# Patient Record
Sex: Male | Born: 1975 | Race: White | Hispanic: No | Marital: Married | Smoking: Current every day smoker
Health system: Southern US, Community
[De-identification: ages and names within clinical notes are randomized; demographics above are authoritative.]

## PROBLEM LIST (undated history)

## (undated) DIAGNOSIS — I10 Essential (primary) hypertension: Secondary | ICD-10-CM

## (undated) DIAGNOSIS — R7303 Prediabetes: Secondary | ICD-10-CM

## (undated) DIAGNOSIS — J189 Pneumonia, unspecified organism: Secondary | ICD-10-CM

## (undated) DIAGNOSIS — J449 Chronic obstructive pulmonary disease, unspecified: Secondary | ICD-10-CM

## (undated) DIAGNOSIS — E785 Hyperlipidemia, unspecified: Secondary | ICD-10-CM

## (undated) DIAGNOSIS — L0591 Pilonidal cyst without abscess: Secondary | ICD-10-CM

## (undated) DIAGNOSIS — L732 Hidradenitis suppurativa: Secondary | ICD-10-CM

## (undated) HISTORY — DX: Chronic obstructive pulmonary disease, unspecified: J44.9

---

## 2012-05-14 ENCOUNTER — Ambulatory Visit: Payer: Self-pay | Admitting: Surgery

## 2012-05-14 LAB — CBC
HGB: 15.8 g/dL (ref 13.0–18.0)
MCH: 32.7 pg (ref 26.0–34.0)
MCHC: 35.2 g/dL (ref 32.0–36.0)
MCV: 93 fL (ref 80–100)
Platelet: 141 10*3/uL — ABNORMAL LOW (ref 150–440)
RBC: 4.83 10*6/uL (ref 4.40–5.90)

## 2012-05-16 ENCOUNTER — Ambulatory Visit: Payer: Self-pay | Admitting: Surgery

## 2012-05-17 LAB — PATHOLOGY REPORT

## 2012-08-15 HISTORY — PX: PILONIDAL CYST EXCISION: SHX744

## 2013-08-15 DIAGNOSIS — J189 Pneumonia, unspecified organism: Secondary | ICD-10-CM

## 2013-08-15 HISTORY — DX: Pneumonia, unspecified organism: J18.9

## 2014-12-02 NOTE — Op Note (Signed)
PATIENT NAME:  Manuel SearlesDEAN, Capri W MR#:  [id number] DATE OF BIRTH:  August 03, 1976  DATE OF PROCEDURE:  05/16/2012  PREOPERATIVE DIAGNOSIS: Pilonidal cyst.   POSTOPERATIVE DIAGNOSIS: Pilonidal cyst.   PROCEDURE: Excision pilonidal cyst.   INDICATION: This 39 year old male had recent pain and fever, chills, and night sweats, with drainage of a large amount of foul-smelling pus from the sacrococcygeal area. He was later examined after his infection resolved and found three pores in the skin indicative of a pilonidal cyst and surgery was recommended for definitive treatment.   DESCRIPTION OF PROCEDURE: The patient was placed on the stretcher in the supine position under general endotracheal anesthesia. He was then rolled over onto the operating table into the prone position, well padded and arms extended on an arm rest. The buttock on the left side was retracted with 3-inch cloth tape exposing the intergluteal cleft. There were three pores identified in the skin. The site was clipped and prepared with Betadine and draped in a sterile manner.   A probe was inserted into the largest pore and extended approximately 2 cm in a cephalad direction and extended down approximately a centimeter deep. Next, the excision was carried out with the probe in place. The skin ellipse was approximately 3 cm in length and dissection was carried down through subcutaneous tissues. The skin ellipse was approximately 6 mm in width and this dissection was carried down beyond the probe down adjacent to the presacral fascia and the lesion was completely excised and submitted in formalin for routine pathology. The wound was inspected. Several small bleeding points were cauterized. The skin and subcutaneous tissues were infiltrated with 0.5% Sensorcaine with epinephrine. The presacral connective tissues were approximated with 4-0 Monocryl. The skin was closed with a running 4-0 Monocryl subcuticular suture and then the skin closure was  reinforced with simple 3-0 nylon sutures. Dressings were applied with benzoin and paper tape.       The patient tolerated the procedure satisfactorily and was then prepared for transfer to the recovery room.   ____________________________ Shela CommonsJ. Renda RollsWilton Smith, MD jws:bjt D: 05/16/2012 11:26:47 ET T: 05/16/2012 11:48:34 ET JOB#: [id number]  cc: Adella HareJ. Wilton Smith, MD, <Dictator> Adella HareWILTON J SMITH MD ELECTRONICALLY SIGNED 05/20/2012 19:29

## 2015-01-30 ENCOUNTER — Emergency Department
Admission: EM | Admit: 2015-01-30 | Discharge: 2015-01-30 | Disposition: A | Discharge status: Home / Self Care | Payer: BLUE CROSS/BLUE SHIELD | Attending: Emergency Medicine | Admitting: Emergency Medicine

## 2015-01-30 DIAGNOSIS — Z880 Allergy status to penicillin: Secondary | ICD-10-CM | POA: Insufficient documentation

## 2015-01-30 DIAGNOSIS — K611 Rectal abscess: Secondary | ICD-10-CM | POA: Insufficient documentation

## 2015-01-30 DIAGNOSIS — Z4801 Encounter for change or removal of surgical wound dressing: Secondary | ICD-10-CM | POA: Insufficient documentation

## 2015-01-30 MED ORDER — OXYCODONE-ACETAMINOPHEN 7.5-325 MG PO TABS: 1.0000 | ORAL_TABLET | ORAL | Status: DC | PRN

## 2015-01-30 MED ORDER — OXYCODONE-ACETAMINOPHEN 5-325 MG PO TABS
1.0000 | ORAL_TABLET | Freq: Once | ORAL | Status: AC
  Administered 2015-01-30: 1 via ORAL

## 2015-01-30 MED ORDER — LIDOCAINE HCL 2 % EX GEL
CUTANEOUS | Status: AC
  Filled 2015-01-30: qty 10

## 2015-01-30 MED ORDER — OXYCODONE-ACETAMINOPHEN 5-325 MG PO TABS
ORAL_TABLET | ORAL | Status: AC
  Filled 2015-01-30: qty 1

## 2015-01-30 NOTE — ED Notes (Signed)
Sent in from [facility] for perirectal abscess. Was seen on Monday and given anusol  Went back on weds and placed on septra  Worse today

## 2015-01-30 NOTE — ED Provider Notes (Signed)
[facility] Emergency Department Provider Note  ____________________________________________  Time seen:    I have reviewed the triage vital signs and the nursing notes.   HISTORY  Chief Complaint Abscess     HPI Manuel Dixon is a 39 y.o. male Noted some swelling and discomfort around his anus approximately a week ago. This past Monday he went to an urgent care center due to his concerns for hemorrhoids. They checked the rectal area and noticed a bulge next to the anus and began treatment for a hemorrhoid. He returned on Wednesday and was seen by a different physician at the same urgent care. He felt that the bulge, which had grown further and was tracking on his perennial area, was an abscess and performed an incision and drainage. The patient was prescribed Bactrim and Flagyl for the suspected infection. He went back to urgent care today and due to worsening pain he was sent to the emergency department.  The patient continues to complain of her brother severe pain in the perianal area. He is humerus and can laugh about aspects of this. But he reports that in the exam is very tender. He has been using hydrocortisone and lidocaine suppositories. He reports that the pain is quite severe when he places the suppository in.   No past medical history on file.  Patient denies health issues other then the acute issue today.   There are no active problems to display for this patient.   No past surgical history on file.  Current Outpatient Rx  Name  Route  Sig  Dispense  Refill  . sulfamethoxazole-trimethoprim (BACTRIM,SEPTRA) 400-80 MG per tablet   Oral   Take 1 tablet by mouth 2 (two) times daily.         Marland Kitchen oxyCODONE-acetaminophen (PERCOCET) 7.5-325 MG per tablet   Oral   Take 1 tablet by mouth every 4 (four) hours as needed for severe pain.   20 tablet   0     Allergies Amoxil  No family history on file.  Social History History  Substance Use  Topics  . Smoking status: Not on file  . Smokeless tobacco: Not on file  . Alcohol Use: Not on file    Review of Systems  Constitutional: Negative for fever. ENT: Negative for sore throat. Cardiovascular: Negative for chest pain. Respiratory: Negative for shortness of breath. Gastrointestinal: Negative for abdominal pain, vomiting and diarrhea. Genitourinary: Negative for dysuria. Musculoskeletal: Negative for back pain. Skin: Negative for rash. Neurological: Negative for headaches Rectal: See history of present illness  10-point ROS otherwise negative.  ____________________________________________   PHYSICAL EXAM:  VITAL SIGNS: ED Triage Vitals  Enc Vitals Group     BP 01/30/15 1749 144/70 mmHg     Pulse Rate 01/30/15 1749 100     Resp 01/30/15 1749 20     Temp 01/30/15 1749 97 F (36.1 C)     Temp Source 01/30/15 1749 Oral     SpO2 01/30/15 1749 100 %     Weight 01/30/15 1749 235 lb (106.595 kg)     Height 01/30/15 1749 5\' 10"  (1.778 m)     Head Cir --      Peak Flow --      Pain Score 01/30/15 1750 9     Pain Loc --      Pain Edu? --      Excl. in GC? --     Constitutional: Alert and oriented. Well appearing. Patient is initially in the  prone position and in no acute distress. Cardiovascular: Normal rate, regular rhythm. Respiratory: Normal respiratory effort without tachypnea. Breath sounds are clear and equal bilaterally. No wheezes/rales/rhonchi. Gastrointestinal: Soft and nontender. No distention.  Rectal: There is a bulging pink strip of tissue that extends from the anterior aspect of the anus and moves forward over the MP there is an incision in this inflamed tissue that was likely majoring the incision and drainage 2 days ago. The area is notably tender. There is minimal erythema around this area. There is no larger nodule or a perirectal abscess noted. Back: No muscle spasm, no tenderness, no CVA tenderness. Musculoskeletal: Nontender with normal range  of motion in all extremities.  No noted edema. Neurologic:  Normal speech and language. No gross focal neurologic deficits are appreciated.  Skin:  Skin is warm, dry. No rash noted. Swelling and tenderness to the perirectal area as noted above. Psychiatric: Mood and affect are normal. Speech and behavior are normal.  ____________________________________________ ____________________________________________   INITIAL IMPRESSION / ASSESSMENT AND PLAN / ED COURSE  The patient has an inflamed area and adjacent to his anus. I think this is a simple perirectal abscess. It does not appear to be a very large abscess but rather more superficial. It is notably tender. During a digital exam I was unable to place my finger into his rectum. I did apply viscous lidocaine over the area to help him with some pain control. I think he appropriate course of action is to continue with the antibiotics and to continue with sitz bath. We have the patient follow-up with general surgery.  ----------------------------------------- 7:35 PM on 01/30/2015 -----------------------------------------  I discussed case with Dr. Lane Hacker. He reports a be glad to see the patient in his office. He agrees with my current treatment plan and agrees that if the patient does not look septic the chance that a CT scan would reveal any deeper pathology is very low. ____________________________________________    FINAL CLINICAL IMPRESSION(S) / ED DIAGNOSES  Final diagnoses:  Perirectal abscess      Darien Ramus, MD 01/30/15 [phone]

## 2015-01-30 NOTE — Discharge Instructions (Signed)
Expect call from Dr. Rutherford Nail office on Monday morning. They will arrange an appointment for you for your follow-up. If you have increasing distress or other problems this weekend, return to the emergency department. Continue your current antibiotics. Take Percocet if needed for pain control. Continue the sitz baths.  Peri-Rectal Abscess Your caregiver has diagnosed you as having a peri-rectal abscess. This is an infected area near the rectum that is filled with pus. If the abscess is near the surface of the skin, your caregiver may open (incise) the area and drain the pus. HOME CARE INSTRUCTIONS   If your abscess was opened up and drained. A small piece of gauze may be placed in the opening so that it can drain. Do not remove the gauze unless directed by your caregiver.  A loose dressing may be placed over the abscess site. Change the dressing as often as necessary to keep it clean and dry.  After the drain is removed, the area may be washed with a gentle antiseptic (soap) four times per day.  A warm sitz bath, warm packs or heating pad may be used for pain relief, taking care not to burn yourself.  Return for a wound check in 1 day or as directed.  An "inflatable doughnut" may be used for sitting with added comfort. These can be purchased at a drugstore or medical supply house.  To reduce pain and straining with bowel movements, eat a high fiber diet with plenty of fruits and vegetables. Use stool softeners as recommended by your caregiver. This is especially important if narcotic type pain medications were prescribed as these may cause marked constipation.  Only take over-the-counter or prescription medicines for pain, discomfort, or fever as directed by your caregiver. SEEK IMMEDIATE MEDICAL CARE IF:   You have increasing pain that is not controlled by medication.  There is increased inflammation (redness), swelling, bleeding, or drainage from the area.  An oral temperature above  102 F (38.9 C) develops.  You develop chills or generalized malaise (feel lethargic or feel "washed out").  You develop any new symptoms (problems) you feel may be related to your present problem. Document Released: 07/29/2000 Document Revised: 10/24/2011 Document Reviewed: 07/29/2008 [facility] Patient Information 2015 [place], [place]. This information is not intended to replace advice given to you by your health care provider. Make sure you discuss any questions you have with your health care provider.

## 2015-01-30 NOTE — ED Notes (Signed)
See previous note by RN.

## 2015-02-02 ENCOUNTER — Ambulatory Visit: Payer: Self-pay | Admitting: Urgent Care

## 2015-02-04 ENCOUNTER — Ambulatory Visit (INDEPENDENT_AMBULATORY_CARE_PROVIDER_SITE_OTHER): Payer: BLUE CROSS/BLUE SHIELD | Admitting: General Surgery

## 2015-02-04 ENCOUNTER — Encounter: Payer: Self-pay | Admitting: General Surgery

## 2015-02-04 VITALS — BP 110/78 | HR 82 | Resp 14 | Ht 70.0 in | Wt 235.0 lb

## 2015-02-04 DIAGNOSIS — K611 Rectal abscess: Secondary | ICD-10-CM | POA: Diagnosis not present

## 2015-02-04 NOTE — Patient Instructions (Signed)
Patient to call on Monday, 02-09-15 with a progress report.

## 2015-02-04 NOTE — Progress Notes (Signed)
Patient ID: Manuel Dixon, male   DOB: Aug 13, 1976, 39 y.o.   MRN: [id number]  Chief Complaint  Patient presents with  . Rectal Problems    perianal abscess    HPI [name] is a 39 y.o. male.  Here today for evaluation of a perianal abscess. He was seen at Urgent care on 01/26/15 and returned on 01/28/15 and had the area lanced and was placed on Flagyl. He was then seen in the ER on 01/30/15 and was placed on pain medications and Bactrim. He reports brown drainage from area with a bad odor. He denies any fever or chills. He reports he is still in pain but it has improved since his ER visit. Pain is worse with standing up or sitting down.  The pain prior to incision and drainage was rated 10 out of 10. Since being on exam biotics for the past 5 days based bar he reports it is down to 4/10.   HPI  No past medical history on file.  Past Surgical History  Procedure Laterality Date  . Pilonidal cyst excision  2014    Dr Katrinka Blazing    No family history on file.  Social History History  Substance Use Topics  . Smoking status: Current Every Day Smoker -- 1.00 packs/day for 20 years    Types: Cigarettes  . Smokeless tobacco: Never Used  . Alcohol Use: 3.6 - 7.2 oz/week    6-12 Standard drinks or equivalent per week    Allergies  Allergen Reactions  . Amoxil [Amoxicillin]   . Codeine Nausea Only    Current Outpatient Prescriptions  Medication Sig Dispense Refill  . metroNIDAZOLE (FLAGYL) 250 MG tablet TK 1 T PO TID FOR 10 DAYS  0  . oxyCODONE-acetaminophen (PERCOCET) 7.5-325 MG per tablet Take 1 tablet by mouth every 4 (four) hours as needed for severe pain. 20 tablet 0  . sulfamethoxazole-trimethoprim (BACTRIM,SEPTRA) 400-80 MG per tablet Take 1 tablet by mouth 2 (two) times daily.    Marland Kitchen lidocaine (XYLOCAINE) 2 % jelly APPLY AA Q 6 H  1   No current facility-administered medications for this visit.    Review of Systems Review of Systems  Constitutional: Negative.  Negative for  chills.  HENT: Negative.   Eyes: Negative.   Respiratory: Negative.   Cardiovascular: Negative.   Gastrointestinal:       Pain with defecation.  Endocrine: Negative.   Genitourinary: Negative.   Neurological: Negative.   Hematological: Negative.   Psychiatric/Behavioral: Negative.     Blood pressure 110/78, pulse 82, resp. rate 14, height  (1.778 m), weight 235 lb (106.595 kg).  Physical Exam Physical Exam  Constitutional: He is oriented to person, place, and time. He appears well-developed and well-nourished.  HENT:  Head: Normocephalic.  Eyes: Conjunctivae are normal.  Neck: Neck supple.  Cardiovascular: Normal rate and regular rhythm.   Pulmonary/Chest: Effort normal and breath sounds normal.  Abdominal: Soft. Bowel sounds are normal. He exhibits no distension. There is no tenderness.  Genitourinary: Prostate normal and penis normal.     Neurological: He is alert and oriented to person, place, and time.  Skin: Skin is warm and dry.    Data Reviewed ED records.  Assessment    Perirectal abscess versus fistula in ano.    Plan    Patient has been asked to call with a progress report on Monday, 02-09-15. If patient is doing better, we will have him follow up at the end of next week  in the office. If the patient is remarkably worse, we will plan on taking the patient to the O.R. on Wednesday, 02-11-15.     PCP:  None  Earline Mayotte 02/05/2015, 8:16 PM

## 2015-02-05 DIAGNOSIS — K611 Rectal abscess: Secondary | ICD-10-CM | POA: Insufficient documentation

## 2015-02-09 ENCOUNTER — Telehealth: Payer: Self-pay | Admitting: *Deleted

## 2015-02-09 NOTE — Telephone Encounter (Signed)
Patient called the office back to report that he is still having blood in his stool, still having drainage, and intermittent pain.   This patient would like to go ahead with surgery on Wednesday, 02-11-15.   Instructions were verbally reviewed by phone today. He will call if he has further questions.

## 2015-02-09 NOTE — Telephone Encounter (Signed)
-----   Message from Manuel MayotteJeffrey W Byrnett, MD sent at 02/05/2015  8:18 PM EDT ----- The patient has been asked to call on Monday, June 27. If he is not showing improvement please schedule him for exam under anesthesia on Wednesday, June 29 as an outpatient. Thank you

## 2015-02-10 ENCOUNTER — Encounter: Payer: Self-pay | Admitting: General Surgery

## 2015-02-10 ENCOUNTER — Encounter: Payer: Self-pay | Admitting: *Deleted

## 2015-02-10 ENCOUNTER — Other Ambulatory Visit: Payer: Self-pay | Admitting: General Surgery

## 2015-02-10 ENCOUNTER — Inpatient Hospital Stay: Admission: RE | Admit: 2015-02-10 | Payer: BLUE CROSS/BLUE SHIELD | Source: Ambulatory Visit

## 2015-02-10 DIAGNOSIS — K611 Rectal abscess: Secondary | ICD-10-CM

## 2015-02-10 NOTE — Addendum Note (Signed)
Addended by: Earline MayotteBYRNETT, Tevyn Codd W on: 02/10/2015 02:46 PM   Modules accepted: Orders

## 2015-02-10 NOTE — Patient Instructions (Signed)
  Your procedure is scheduled on: 02-11-15 Report to [facility] 2ND FLOOR To find out your arrival time please call [phone] between 1PM - 3PM on 02-10-15  Remember: Instructions that are not followed completely may result in serious medical risk, up to and including death, or upon the discretion of your surgeon and anesthesiologist your surgery may need to be rescheduled.    _X___ 1. Do not eat food or drink liquids after midnight. No gum chewing or hard candies.     _X___ 2. No Alcohol for 24 hours before or after surgery.   ____ 3. Bring all medications with you on the day of surgery if instructed.    ____ 4. Notify your doctor if there is any change in your medical condition     (cold, fever, infections).     Do not wear jewelry, make-up, hairpins, clips or nail polish.  Do not wear lotions, powders, or perfumes. You may wear deodorant.  Do not shave 48 hours prior to surgery. Men may shave face and neck.  Do not bring valuables to the hospital.    [facility] not [facility] for any belongings or valuables.               Contacts, dentures or bridgework may not be worn into surgery.  Leave your suitcase in the car. After surgery it may be brought to your room.  For patients admitted to the hospital, discharge time is determined by your treatment team.   Patients discharged the day of surgery will not be allowed to drive home.   Please read over the following fact sheets that you were given:      ____ Take these medicines the morning of surgery with A SIP OF WATER:    1. MAY TAKE PERCOCET IF NEEDED WITH A SMALL SIP OF WATER  2.   3.   4.  5.  6.  ____ Fleet Enema (as directed)   ____ Use CHG Soap as directed  ____ Use inhalers on the day of surgery  ____ Stop metformin 2 days prior to surgery    ____ Take 1/2 of usual insulin dose the night before surgery and none on the morning of surgery.   ____ Stop Coumadin/Plavix/aspirin-N/A  ____  Stop Anti-inflammatories-NO NSAIDS OR ASA PRODUCTS-PERCOCET OK   ____ Stop supplements until after surgery.    ____ Bring C-Pap to the hospital.

## 2015-02-10 NOTE — H&P (Signed)
Chief Complaint  Patient presents with  . Rectal Problems    perianal abscess    HPI Manuel Dixon is a 39 y.o. male. Here today for evaluation of a perianal abscess. He was seen at Urgent care on 01/26/15 and returned on 01/28/15 and had the area lanced and was placed on Flagyl. He was then seen in the ER on 01/30/15 and was placed on pain medications and Bactrim. He reports brown drainage from area with a bad odor. He denies any fever or chills. He reports he is still in pain but it has improved since his ER visit. Pain is worse with standing up or sitting down.  The pain prior to incision and drainage was rated 10 out of 10. Since being on exam biotics for the past 5 days based bar he reports it is down to 4/10.   HPI  No past medical history on file.  Past Surgical History  Procedure Laterality Date  . Pilonidal cyst excision  2014    Dr Katrinka BlazingSmith    No family history on file.  Social History History  Substance Use Topics  . Smoking status: Current Every Day Smoker -- 1.00 packs/day for 20 years    Types: Cigarettes  . Smokeless tobacco: Never Used  . Alcohol Use: 3.6 - 7.2 oz/week    6-12 Standard drinks or equivalent per week    Allergies  Allergen Reactions  . Amoxil [Amoxicillin]   . Codeine Nausea Only    Current Outpatient Prescriptions  Medication Sig Dispense Refill  . metroNIDAZOLE (FLAGYL) 250 MG tablet TK 1 T PO TID FOR 10 DAYS  0  . oxyCODONE-acetaminophen (PERCOCET) 7.5-325 MG per tablet Take 1 tablet by mouth every 4 (four) hours as needed for severe pain. 20 tablet 0  . sulfamethoxazole-trimethoprim (BACTRIM,SEPTRA) 400-80 MG per tablet Take 1 tablet by mouth 2 (two) times daily.    Marland Kitchen. lidocaine (XYLOCAINE) 2 % jelly APPLY AA Q 6 H  1   No current facility-administered medications for this visit.    Review of Systems Review of Systems  Constitutional: Negative. Negative  for chills.  HENT: Negative.  Eyes: Negative.  Respiratory: Negative.  Cardiovascular: Negative.  Gastrointestinal:   Pain with defecation.  Endocrine: Negative.  Genitourinary: Negative.  Neurological: Negative.  Hematological: Negative.  Psychiatric/Behavioral: Negative.    Blood pressure 110/78, pulse 82, resp. rate 14, height 5\' 10"  (1.778 m), weight 235 lb (106.595 kg).  Physical Exam Physical Exam  Constitutional: He is oriented to person, place, and time. He appears well-developed and well-nourished.  HENT:  Head: Normocephalic.  Eyes: Conjunctivae are normal.  Neck: Neck supple.  Cardiovascular: Normal rate and regular rhythm.  Pulmonary/Chest: Effort normal and breath sounds normal.  Abdominal: Soft. Bowel sounds are normal. He exhibits no distension. There is no tenderness.  Genitourinary: Prostate normal and penis normal.     Neurological: He is alert and oriented to person, place, and time.  Skin: Skin is warm and dry.    Data Reviewed ED records.  Assessment    Perirectal abscess versus fistula in ano.    Plan    Patient has been asked to call with a progress report on Monday, 02-09-15. If patient is doing better, we will have him follow up at the end of next week in the office. If the patient is remarkably worse, we will plan on taking the patient to the O.R. on Wednesday, 02-11-15.         Earline MayotteByrnett, Alaia Lordi W

## 2015-02-11 ENCOUNTER — Encounter: Payer: Self-pay | Admitting: *Deleted

## 2015-02-11 ENCOUNTER — Ambulatory Visit
Admission: RE | Admit: 2015-02-11 | Discharge: 2015-02-11 | Disposition: A | Discharge status: Home / Self Care | Payer: BLUE CROSS/BLUE SHIELD | Source: Ambulatory Visit | Attending: General Surgery | Admitting: General Surgery

## 2015-02-11 ENCOUNTER — Encounter
Admission: RE | Disposition: A | Discharge status: Home / Self Care | Payer: Self-pay | Source: Ambulatory Visit | Attending: General Surgery

## 2015-02-11 ENCOUNTER — Ambulatory Visit: Payer: BLUE CROSS/BLUE SHIELD | Admitting: *Deleted

## 2015-02-11 DIAGNOSIS — Z885 Allergy status to narcotic agent status: Secondary | ICD-10-CM | POA: Insufficient documentation

## 2015-02-11 DIAGNOSIS — Z79899 Other long term (current) drug therapy: Secondary | ICD-10-CM | POA: Insufficient documentation

## 2015-02-11 DIAGNOSIS — Z79891 Long term (current) use of opiate analgesic: Secondary | ICD-10-CM | POA: Insufficient documentation

## 2015-02-11 DIAGNOSIS — K611 Rectal abscess: Secondary | ICD-10-CM | POA: Diagnosis not present

## 2015-02-11 DIAGNOSIS — K603 Anal fistula: Secondary | ICD-10-CM | POA: Diagnosis not present

## 2015-02-11 DIAGNOSIS — F17210 Nicotine dependence, cigarettes, uncomplicated: Secondary | ICD-10-CM | POA: Insufficient documentation

## 2015-02-11 DIAGNOSIS — K625 Hemorrhage of anus and rectum: Secondary | ICD-10-CM | POA: Diagnosis not present

## 2015-02-11 DIAGNOSIS — Z881 Allergy status to other antibiotic agents status: Secondary | ICD-10-CM | POA: Insufficient documentation

## 2015-02-11 HISTORY — DX: Pneumonia, unspecified organism: J18.9

## 2015-02-11 HISTORY — PX: INCISION AND DRAINAGE PERIRECTAL ABSCESS: SHX1804

## 2015-02-11 SURGERY — INCISION AND DRAINAGE, ABSCESS, PERIRECTAL
Anesthesia: General | Wound class: Clean Contaminated

## 2015-02-11 MED ORDER — FAMOTIDINE 20 MG PO TABS
ORAL_TABLET | ORAL | Status: AC
  Administered 2015-02-11: 20 mg via ORAL
  Filled 2015-02-11: qty 1

## 2015-02-11 MED ORDER — ONDANSETRON HCL 4 MG/2ML IJ SOLN
INTRAMUSCULAR | Status: DC | PRN
  Administered 2015-02-11: 4 mg via INTRAVENOUS

## 2015-02-11 MED ORDER — KETOROLAC TROMETHAMINE 30 MG/ML IJ SOLN
INTRAMUSCULAR | Status: DC | PRN
  Administered 2015-02-11: 30 mg via INTRAVENOUS

## 2015-02-11 MED ORDER — FENTANYL CITRATE (PF) 100 MCG/2ML IJ SOLN: 25.0000 ug | INTRAMUSCULAR | Status: DC | PRN

## 2015-02-11 MED ORDER — FAMOTIDINE 20 MG PO TABS
20.0000 mg | ORAL_TABLET | Freq: Once | ORAL | Status: AC
  Administered 2015-02-11: 20 mg via ORAL

## 2015-02-11 MED ORDER — BUPIVACAINE-EPINEPHRINE 0.5% -1:200000 IJ SOLN
INTRAMUSCULAR | Status: DC | PRN
  Administered 2015-02-11: 30 mL

## 2015-02-11 MED ORDER — FENTANYL CITRATE (PF) 100 MCG/2ML IJ SOLN
INTRAMUSCULAR | Status: DC | PRN
  Administered 2015-02-11 (×5): 50 ug via INTRAVENOUS

## 2015-02-11 MED ORDER — MIDAZOLAM HCL 2 MG/2ML IJ SOLN
INTRAMUSCULAR | Status: DC | PRN
  Administered 2015-02-11: 2 mg via INTRAVENOUS

## 2015-02-11 MED ORDER — BUPIVACAINE-EPINEPHRINE (PF) 0.5% -1:200000 IJ SOLN
INTRAMUSCULAR | Status: AC
  Filled 2015-02-11: qty 30

## 2015-02-11 MED ORDER — PROPOFOL 10 MG/ML IV BOLUS
INTRAVENOUS | Status: DC | PRN
  Administered 2015-02-11: 200 mg via INTRAVENOUS

## 2015-02-11 MED ORDER — LACTATED RINGERS IV SOLN
Freq: Once | INTRAVENOUS | Status: AC
  Administered 2015-02-11: 14:00:00 via INTRAVENOUS

## 2015-02-11 MED ORDER — SODIUM CHLORIDE 0.9 % IV SOLN
1.0000 g | INTRAVENOUS | Status: AC
  Administered 2015-02-11: 15:00:00 via INTRAVENOUS
  Filled 2015-02-11: qty 1

## 2015-02-11 MED ORDER — ACETAMINOPHEN 10 MG/ML IV SOLN
INTRAVENOUS | Status: DC | PRN
Start: 2015-02-11 — End: 2015-02-11
  Administered 2015-02-11: 1000 mg via INTRAVENOUS

## 2015-02-11 MED ORDER — ACETAMINOPHEN 10 MG/ML IV SOLN
INTRAVENOUS | Status: AC
  Filled 2015-02-11: qty 100

## 2015-02-11 MED ORDER — LIDOCAINE HCL (CARDIAC) 20 MG/ML IV SOLN
INTRAVENOUS | Status: DC | PRN
  Administered 2015-02-11: 60 mg via INTRAVENOUS

## 2015-02-11 MED ORDER — LACTATED RINGERS IV SOLN
INTRAVENOUS | Status: DC | PRN
  Administered 2015-02-11: 15:00:00 via INTRAVENOUS

## 2015-02-11 MED ORDER — ONDANSETRON HCL 4 MG/2ML IJ SOLN: 4.0000 mg | Freq: Once | INTRAMUSCULAR | Status: DC | PRN

## 2015-02-11 MED ORDER — BACITRACIN ZINC 500 UNIT/GM EX OINT
TOPICAL_OINTMENT | CUTANEOUS | Status: AC
  Filled 2015-02-11: qty 28.35

## 2015-02-11 SURGICAL SUPPLY — 30 items
BLADE CLIPPER SURG (BLADE) ×3 IMPLANT
BLADE SURG 11 STRL SS SAFETY (MISCELLANEOUS) ×3 IMPLANT
BRIEF STRETCH MATERNITY 2XLG (MISCELLANEOUS) ×3 IMPLANT
CANISTER SUCT 1200ML W/VALVE (MISCELLANEOUS) ×3 IMPLANT
DRAIN PENROSE 5/8X18 LTX STRL (WOUND CARE) IMPLANT
DRAPE LAPAROTOMY 100X77 ABD (DRAPES) ×3 IMPLANT
DRAPE LEGGINS SURG 28X43 STRL (DRAPES) ×3 IMPLANT
DRAPE UNDER BUTTOCK W/FLU (DRAPES) ×3 IMPLANT
GLOVE BIO SURGEON STRL SZ7.5 (GLOVE) ×6 IMPLANT
GLOVE INDICATOR 8.0 STRL GRN (GLOVE) ×6 IMPLANT
GOWN STRL REUS W/ TWL LRG LVL3 (GOWN DISPOSABLE) ×2 IMPLANT
GOWN STRL REUS W/TWL LRG LVL3 (GOWN DISPOSABLE) ×4
JELLY LUB 2OZ STRL (MISCELLANEOUS) ×2
JELLY LUBE 2OZ STRL (MISCELLANEOUS) ×1 IMPLANT
KIT RM TURNOVER STRD PROC AR (KITS) ×3 IMPLANT
LABEL OR SOLS (LABEL) ×3 IMPLANT
NDL SAFETY 25GX1.5 (NEEDLE) IMPLANT
NS IRRIG 500ML POUR BTL (IV SOLUTION) ×3 IMPLANT
PACK BASIN MINOR ARMC (MISCELLANEOUS) ×3 IMPLANT
PAD GROUND ADULT SPLIT (MISCELLANEOUS) ×3 IMPLANT
PAD OB MATERNITY 4.3X12.25 (PERSONAL CARE ITEMS) ×3 IMPLANT
PAD PREP 24X41 OB/GYN DISP (PERSONAL CARE ITEMS) ×3 IMPLANT
SOL PREP PVP 2OZ (MISCELLANEOUS) ×3
SOLUTION PREP PVP 2OZ (MISCELLANEOUS) ×1 IMPLANT
SUT SILK 0 SH 30 (SUTURE) IMPLANT
SUT VIC AB 3-0 SH 27 (SUTURE)
SUT VIC AB 3-0 SH 27X BRD (SUTURE) IMPLANT
SWAB CULTURE AMIES ANAERIB BLU (MISCELLANEOUS) IMPLANT
SYR BULB IRRIG 60ML STRL (SYRINGE) ×3 IMPLANT
SYR CONTROL 10ML (SYRINGE) IMPLANT

## 2015-02-11 NOTE — Transfer of Care (Signed)
Immediate Anesthesia Transfer of Care Note  Patient: Manuel Dixon  Procedure(s) Performed: Procedure(s): IRRIGATION AND DEBRIDEMENT PERIRECTAL ABSCESS (N/A)  Patient Location: PACU  Anesthesia Type:General  Level of Consciousness: awake, alert  and oriented  Airway & Oxygen Therapy: Patient Spontanous Breathing and Patient connected to face mask oxygen  Post-op Assessment: Report given to RN and Post -op Vital signs reviewed and stable  Post vital signs: Reviewed and stable  Last Vitals:  Filed Vitals:   02/11/15 1600  BP: 139/87  Pulse: 79  Temp: 36.2 C  Resp: 18    Complications: No apparent anesthesia complications

## 2015-02-11 NOTE — Anesthesia Preprocedure Evaluation (Signed)
Anesthesia Evaluation  Patient identified by MRN, date of birth, ID band Patient awake    Reviewed: Allergy & Precautions, NPO status , Patient's Chart, lab work & pertinent test results  Airway Mallampati: II  TM Distance: >3 FB Neck ROM: Full    Dental no notable dental hx.    Pulmonary pneumonia -, resolved, Current Smoker,  breath sounds clear to auscultation  Pulmonary exam normal       Cardiovascular negative cardio ROS Normal cardiovascular exam    Neuro/Psych negative neurological ROS  negative psych ROS   GI/Hepatic Neg liver ROS, Perirectal abcess   Endo/Other  negative endocrine ROS  Renal/GU negative Renal ROS  negative genitourinary   Musculoskeletal negative musculoskeletal ROS (+)   Abdominal Normal abdominal exam  (+)   Peds negative pediatric ROS (+)  Hematology negative hematology ROS (+)   Anesthesia Other Findings   Reproductive/Obstetrics                             Anesthesia Physical Anesthesia Plan  ASA: II  Anesthesia Plan: General   Post-op Pain Management:    Induction: Intravenous  Airway Management Planned: LMA and Oral ETT  Additional Equipment:   Intra-op Plan:   Post-operative Plan: Extubation in OR  Informed Consent: I have reviewed the patients History and Physical, chart, labs and discussed the procedure including the risks, benefits and alternatives for the proposed anesthesia with the patient or authorized representative who has indicated his/her understanding and acceptance.   Dental advisory given  Plan Discussed with: CRNA and Surgeon  Anesthesia Plan Comments:         Anesthesia Quick Evaluation

## 2015-02-11 NOTE — Discharge Instructions (Signed)
AMBULATORY SURGERY  DISCHARGE INSTRUCTIONS   1) The drugs that you were given will stay in your system until tomorrow so for the next 24 hours you should not:  A) Drive an automobile B) Make any legal decisions C) Drink any alcoholic beverage   2) You may resume regular meals tomorrow.  Today it is better to start with liquids and gradually work up to solid foods.  You may eat anything you prefer, but it is better to start with liquids, then soup and crackers, and gradually work up to solid foods.   3) Please notify your doctor immediately if you have any unusual bleeding, trouble breathing, redness and pain at the surgery site, drainage, fever, or pain not relieved by medication.  4) Your post-operative visit with Dr.    Druscilla BrowniePorfilio                                 is: Date:                        Time:    Please call to schedule your post-operative visit.   5) Additional Instructions: 6)

## 2015-02-11 NOTE — Anesthesia Procedure Notes (Signed)
Procedure Name: LMA Insertion Date/Time: 02/11/2015 3:28 PM Performed by: Lily KocherPERALTA, Theodosia Bahena Pre-anesthesia Checklist: Patient identified Patient Re-evaluated:Patient Re-evaluated prior to inductionOxygen Delivery Method: Circle system utilized Preoxygenation: Pre-oxygenation with 100% oxygen Intubation Type: IV induction Ventilation: Two handed mask ventilation required LMA: LMA inserted LMA Size: 5.0 Number of attempts: 1 Tube secured with: Tape Dental Injury: Teeth and Oropharynx as per pre-operative assessment

## 2015-02-11 NOTE — H&P (Signed)
Continued pain and bleeding from peri-rectal process. For EUA, drainage.

## 2015-02-11 NOTE — Op Note (Signed)
Preoperative diagnosis: Perirectal abscess.  Postoperative diagnosis: Fistula and anal.  Operative procedure: Sigmoidoscopy, debridement of anal fistula.  Operating surgeon: Lane HackerJeffery Byrnett, M.D.  Anesthesia: Gen. by LMA, Marcaine 0.5% with 1-200,000 units of epinephrine, 30 mL.  Estimated blood loss: 10 mL.  Clinical note-year-old male has had a 1-2 week history of perianal pain and swelling status post incision and drainage of another facility. He said persistent discomfort including blood in his stool. He is brought In for exam under anesthesia.. The patient received Invanz prior to the procedure.   Operative note: With the patient under adequate general anesthesia was placed in dorsal lithotomy position. The perineum was prepped with Betadine and draped. A lacrimal duct probe was used to identify the internal opening of a superficial fistula. This was laid bare and the chronic inflammatory tissue excised and sent for routine histology. It did track anteriorly towards the scrotum less than 10 mm. This area was completely opened and debrided with a curet. Rigid sigmoidoscope was advanced 15 cm at which point form stool prevented further passage. The stool was pale brown in color. No blood noted. No mucosal lesions. Hemostasis in the fistulotomy site was with electrocautery. At about a quite most applied to the area followed by dry dressing. The patient tolerated the procedure well was brought to recovery in stable condition.

## 2015-02-11 NOTE — Anesthesia Postprocedure Evaluation (Signed)
  Anesthesia Post-op Note  Patient: Manuel SearlesMichael W Dixon  Procedure(s) Performed: Procedure(s): IRRIGATION AND DEBRIDEMENT PERIRECTAL ABSCESS (N/A)  Anesthesia type:General  Patient location: PACU  Post pain: Pain level controlled  Post assessment: Post-op Vital signs reviewed, Patient's Cardiovascular Status Stable, Respiratory Function Stable, Patent Airway and No signs of Nausea or vomiting  Post vital signs: Reviewed and stable  Last Vitals:  Filed Vitals:   02/11/15 1631  BP: 127/89  Pulse: 76  Temp:   Resp: 18    Level of consciousness: awake, alert  and patient cooperative  Complications: No apparent anesthesia complications

## 2015-02-12 ENCOUNTER — Encounter: Payer: Self-pay | Admitting: General Surgery

## 2015-02-17 LAB — SURGICAL PATHOLOGY

## 2015-02-19 ENCOUNTER — Ambulatory Visit (INDEPENDENT_AMBULATORY_CARE_PROVIDER_SITE_OTHER): Payer: BLUE CROSS/BLUE SHIELD | Admitting: General Surgery

## 2015-02-19 VITALS — BP 124/76 | HR 78 | Resp 12 | Ht 70.0 in | Wt 234.0 lb

## 2015-02-19 DIAGNOSIS — K611 Rectal abscess: Secondary | ICD-10-CM

## 2015-02-19 NOTE — Progress Notes (Signed)
Patient ID: Manuel Dixon, male   DOB: 26-May-1976, 39 y.o.   MRN: [id number]  Chief Complaint  Patient presents with  . Routine Post Op    perirectal abscess    HPI Manuel SearlesMichael W Tibbs is a 39 y.o. male here today for his follow up perirectal abscess debridement done on 02/11/15/ The area is still draining and very sore.   The patient reports no leakage of flatus or stool per rectum. No urinary difficulties. HPI  Past Medical History  Diagnosis Date  . Pneumonia 2015    Past Surgical History  Procedure Laterality Date  . Pilonidal cyst excision  2014    Dr Katrinka BlazingSmith  . Incision and drainage perirectal abscess N/A 02/11/2015    Procedure: IRRIGATION AND DEBRIDEMENT PERIRECTAL ABSCESS;  Surgeon: Earline MayotteJeffrey W Gennette Shadix, MD;  Location: [facility] ORS;  Service: General;  Laterality: N/A;    No family history on file.  Social History History  Substance Use Topics  . Smoking status: Current Every Day Smoker -- 1.00 packs/day for 20 years    Types: Cigarettes  . Smokeless tobacco: Never Used  . Alcohol Use: 3.6 - 7.2 oz/week    6-12 Standard drinks or equivalent per week     Comment: PT NORMALLY DOES DRINK BUT HAS HAD NO ETOH IN 2 WEEKS PER PT (02-10-15)    Allergies  Allergen Reactions  . Amoxil [Amoxicillin]   . Codeine Nausea Only  . Hydrocodone Nausea And Vomiting    Current Outpatient Prescriptions  Medication Sig Dispense Refill  . lidocaine (XYLOCAINE) 2 % jelly APPLY AA Q 6 H  1  . oxyCODONE-acetaminophen (PERCOCET) 7.5-325 MG per tablet Take 1 tablet by mouth every 4 (four) hours as needed for severe pain. 20 tablet 0   No current facility-administered medications for this visit.    Review of Systems Review of Systems  Constitutional: Negative.   Respiratory: Negative.   Cardiovascular: Negative.     Blood pressure 124/76, pulse 78, resp. rate 12, height 5\' 10"  (1.778 m), weight 234 lb (106.142 kg).  Physical Exam Physical Exam  Constitutional: He is oriented to person,  place, and time. He appears well-developed and well-nourished.  HENT:  Mouth/Throat: Oropharynx is clear and moist.  Genitourinary:     Neurological: He is alert and oriented to person, place, and time.  Skin: Skin is warm and dry.   Internal wound is healing well.   Data Reviewed 02/11/2015 pathology: DIAGNOSIS:  A. ANAL FISSURE; EXCISION:  - SQUAMOUS MUCOSA WITH ULCERATION AND UNDERLYING FIBROUS TISSUE WITH  CHRONIC INFLAMMATION.  - NEGATIVE FOR DYSPLASIA AND MALIGNANCY.    Assessment    Doing well status post anal fistulotomy.    Plan    The patient couldn't is plate continued improvement over the coming weeks. 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  We'll plan on a follow-up examination in 3 weeks.      Earline MayotteByrnett, Rei Medlen W 02/21/2015, 9:24 AM

## 2015-02-19 NOTE — Patient Instructions (Signed)
Patient to return in three weeks.  

## 2015-03-11 ENCOUNTER — Ambulatory Visit: Payer: BLUE CROSS/BLUE SHIELD | Admitting: General Surgery

## 2015-04-21 ENCOUNTER — Encounter: Payer: Self-pay | Admitting: *Deleted

## 2016-07-14 DIAGNOSIS — R739 Hyperglycemia, unspecified: Secondary | ICD-10-CM | POA: Insufficient documentation

## 2016-07-14 DIAGNOSIS — E7849 Other hyperlipidemia: Secondary | ICD-10-CM | POA: Insufficient documentation

## 2016-07-14 DIAGNOSIS — R053 Chronic cough: Secondary | ICD-10-CM | POA: Insufficient documentation

## 2016-07-14 DIAGNOSIS — I10 Essential (primary) hypertension: Secondary | ICD-10-CM | POA: Insufficient documentation

## 2016-07-20 DIAGNOSIS — J449 Chronic obstructive pulmonary disease, unspecified: Secondary | ICD-10-CM | POA: Insufficient documentation

## 2016-09-29 DIAGNOSIS — R0683 Snoring: Secondary | ICD-10-CM | POA: Insufficient documentation

## 2017-05-18 ENCOUNTER — Telehealth: Payer: Self-pay | Admitting: General Surgery

## 2017-05-18 NOTE — Telephone Encounter (Signed)
I LEFT MESSAGE FOR PATIENT TO CALL  & SCHEDULE AN APPOINTMENT TO SEE DR BYRNETT.RET PT 2016 REF DR [facility] CAREW EVAL ANAL ABCESS.

## 2017-05-22 ENCOUNTER — Encounter: Payer: Self-pay | Admitting: *Deleted

## 2017-05-23 ENCOUNTER — Encounter: Payer: Self-pay | Admitting: General Surgery

## 2017-05-23 ENCOUNTER — Ambulatory Visit (INDEPENDENT_AMBULATORY_CARE_PROVIDER_SITE_OTHER): Payer: BLUE CROSS/BLUE SHIELD | Admitting: General Surgery

## 2017-05-23 VITALS — BP 132/92 | HR 84 | Temp 97.9°F | Resp 12 | Ht 71.0 in | Wt 236.0 lb

## 2017-05-23 DIAGNOSIS — K611 Rectal abscess: Secondary | ICD-10-CM

## 2017-05-23 NOTE — Progress Notes (Signed)
Patient ID: Manuel Dixon, male   DOB: 08-14-1976, 41 y.o.   MRN: [id number]  Chief Complaint  Patient presents with  . Abscess    HPI Manuel Dixon is a 41 y.o. male.  Here for evaluation of an anal abscess referred by Dr Bobby Rumpf at walk in clinic. He was given doxycycline and ibuprofen 800 mg. He had perirectal abscess debridement done on 02/11/15.  He noticed some swelling near the area last time about 2 weeks ago. He states he did a warm soak and it opened up to draining milky brown drainage. No drainage for 2 days. He states the area feels better but is still sensitive.  HPI  Past Medical History:  Diagnosis Date  . Pneumonia 2015    Past Surgical History:  Procedure Laterality Date  . INCISION AND DRAINAGE PERIRECTAL ABSCESS N/A 02/11/2015   Procedure: IRRIGATION AND DEBRIDEMENT PERIRECTAL ABSCESS;  Surgeon: Earline Mayotte, MD;  Location: [facility] ORS;  Service: General;  Laterality: N/A;  . PILONIDAL CYST EXCISION  2014   Dr Katrinka Blazing    Family History  Problem Relation Age of Onset  . Colon cancer Neg Hx     Social History Social History  Substance Use Topics  . Smoking status: Current Every Day Smoker    Packs/day: 1.00    Years: 20.00    Types: Cigarettes  . Smokeless tobacco: Never Used  . Alcohol use 3.6 - 7.2 oz/week    6 - 12 Standard drinks or equivalent per week     Comment: PT NORMALLY DOES DRINK BUT HAS HAD NO ETOH IN 2 WEEKS PER PT (02-10-15)    Allergies  Allergen Reactions  . Amoxil [Amoxicillin]   . Codeine Nausea Only  . Hydrocodone Nausea And Vomiting    Current Outpatient Prescriptions  Medication Sig Dispense Refill  . albuterol (PROVENTIL HFA;VENTOLIN HFA) 108 (90 Base) MCG/ACT inhaler 2 puffs q.i.d. p.r.n. short of breath, wheezing, or cough    . doxycycline (VIBRAMYCIN) 100 MG capsule Take 100 mg by mouth 2 (two) times daily.     Marland Kitchen ibuprofen (ADVIL,MOTRIN) 800 MG tablet Take 800 mg by mouth every 8 (eight) hours as needed.     Marland Kitchen  losartan (COZAAR) 50 MG tablet Take 50 mg by mouth daily.     Marland Kitchen tiotropium (SPIRIVA) 18 MCG inhalation capsule Place into inhaler and inhale as needed.      No current facility-administered medications for this visit.     Review of Systems Review of Systems  Constitutional: Negative.   Respiratory: Negative.   Cardiovascular: Negative.     Blood pressure (!) 132/92, pulse 84, temperature 97.9 F (36.6 C), temperature source Oral, resp. rate 12, height  (1.803 m), weight 236 lb (107 kg).  Physical Exam Physical Exam  Constitutional: He is oriented to person, place, and time. He appears well-developed and well-nourished.  HENT:  Mouth/Throat: Oropharynx is clear and moist.  Eyes: Conjunctivae are normal. No scleral icterus.  Neck: Neck supple.  Cardiovascular: Normal rate, regular rhythm and normal heart sounds.   Pulmonary/Chest: Effort normal and breath sounds normal.  Genitourinary:     Genitourinary Comments: Thickening left anterior   Lymphadenopathy:    He has no cervical adenopathy.  Neurological: He is alert and oriented to person, place, and time.  Skin: Skin is warm and dry.  Psychiatric: His behavior is normal.    Data Reviewed Operative note of 02/11/2015 showed a superficial anal fistula with negative rigid sigmoidoscopy. Review  of the office note from 02/04/2015 showed an inflammatory ridge along the midline.  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    Plan    The patient's clinical exam is minimal at this time with mild inflammatory tenderness to the left of the midline near the anal opening at the 11-12:00 position. There may be a small punctate opening approximately 3 cm from the rectum just to left of the midline raphe, but no fluctuance or Pear[place]'s is noted today.  The patient has a few more days of his oral antibiotics and he was encouraged to complete these. Continue using warm soaks for  relief.  His present episode began with a bout of diarrhea, and this may have been the triggering factor for recurrent perianal inflammation.  The patient should expect ongoing improvement over the next week, and if he fails to improve he's been encouraged to call for earlier reassessment through this office.  The role of cigarette smoking and infections was reviewed. There is certainly a link between smokers and recurrent bouts of diverticulitis, and there is no reason to think that the same mechanism would not apply to perineal inflammatory processes.  There is no family history of colon cancer, polyps or inflammatory bowel disease. At this time, I don't think colonoscopy is mandated.    May return as needed. May return to work Thursday  HPI, Physical Exam, Assessment and Plan have been scribed under the direction and in the presence of Earline Mayotte, MD. Dorathy Daft, RN  I have completed the exam and reviewed the above documentation for accuracy and completeness.  I agree with the above.  Museum/gallery conservator has been used and any errors in dictation or transcription are unintentional.  Donnalee Curry, M.D., F.A.C.S.  Earline Mayotte 05/23/2017, 9:32 PM

## 2017-05-23 NOTE — Patient Instructions (Signed)
The patient is aware to call back for any questions or concerns.  

## 2020-01-23 ENCOUNTER — Encounter: Payer: Self-pay | Admitting: Surgery

## 2020-01-23 ENCOUNTER — Other Ambulatory Visit: Payer: Self-pay

## 2020-01-23 ENCOUNTER — Ambulatory Visit: Payer: BC Managed Care – PPO | Admitting: Surgery

## 2020-01-23 VITALS — BP 155/93 | HR 99 | Temp 97.7°F | Resp 12 | Ht 70.0 in | Wt 235.0 lb

## 2020-01-23 DIAGNOSIS — K610 Anal abscess: Secondary | ICD-10-CM | POA: Diagnosis not present

## 2020-01-23 MED ORDER — METRONIDAZOLE 500 MG PO TABS: 500.0000 mg | ORAL_TABLET | Freq: Two times a day (BID) | ORAL | 0 refills | Status: AC

## 2020-01-23 NOTE — Patient Instructions (Addendum)
Pick up medication at [facility].  Please continue with sitz baths. Please call our office Friday 01/24/20  before 12:00  and I will let you know when the MRI will be scheduled.   Our surgery scheduler will call you within 24-48 hours. Please have the Blue surgery sheet available when speaking with her.

## 2020-01-23 NOTE — Progress Notes (Signed)
Patient ID: Manuel Dixon, male   DOB: [birth year], 44 y.o.   MRN: [id number]  Chief Complaint: Recurrent perirectal/perianal abscess.  History of Present Illness Manuel Dixon is a 44 y.o. male with prior history of I&D of perirectal abscess over 3 years ago.  Recurring at the time of his wedding 21st of May.  Spontaneous drainage and resolution, now with recurrence of pain and buildup of pressure.  He now has begun draining over the last week,  as well still having sharp perianal/coccygeal sharp stabbing pain, "hot needle."  Somewhat reminiscent of his pilonidal surgery, and the loss of soft tissue in that region.  Reports mild fever, without chills, denies n/v.  Reports a bowel movement that showed some bloody discharge, concerned that he may be having some drainage internally as well.  Past Medical History Past Medical History:  Diagnosis Date  . COPD (chronic obstructive pulmonary disease) ([place])   . Pneumonia 2015      Past Surgical History:  Procedure Laterality Date  . INCISION AND DRAINAGE PERIRECTAL ABSCESS N/A 02/11/2015   Procedure: IRRIGATION AND DEBRIDEMENT PERIRECTAL ABSCESS;  Surgeon: Robert Bellow, MD;  Location: [facility] ORS;  Service: General;  Laterality: N/A;  . PILONIDAL CYST EXCISION  2014   Dr Tamala Julian    Allergies  Allergen Reactions  . Amoxil [Amoxicillin]   . Codeine Nausea Only  . Hydrocodone Nausea And Vomiting    Current Outpatient Medications  Medication Sig Dispense Refill  . albuterol (PROVENTIL HFA;VENTOLIN HFA) 108 (90 Base) MCG/ACT inhaler 2 puffs q.i.d. p.r.n. short of breath, wheezing, or cough    . losartan (COZAAR) 50 MG tablet Take 50 mg by mouth daily.     Marland Kitchen tiotropium (SPIRIVA) 18 MCG inhalation capsule Place into inhaler and inhale as needed.      No current facility-administered medications for this visit.    Family History Family History  Problem Relation Age of Onset  . Heart attack Mother   . Colon cancer Neg Hx       Social  History Social History   Tobacco Use  . Smoking status: Current Every Day Smoker    Packs/day: 1.00    Years: 20.00    Pack years: 20.00    Types: Cigarettes  . Smokeless tobacco: Never Used  Substance Use Topics  . Alcohol use: Yes    Alcohol/week: 6.0 - 12.0 standard drinks    Types: 6 - 12 Standard drinks or equivalent per week    Comment: PT NORMALLY DOES DRINK BUT HAS HAD NO ETOH IN 2 WEEKS PER PT (02-10-15)  . Drug use: No        Review of Systems  All other systems reviewed and are negative.     Physical Exam Blood pressure (!) 155/93, pulse 99, temperature 97.7 F (36.5 C), temperature source Oral, resp. rate 12, height 5\' 10"  (1.778 m), weight 235 lb (106.6 kg), SpO2 96 %. Last Weight  Most recent update: 01/23/2020  3:44 PM   Weight  106.6 kg (235 lb)            CONSTITUTIONAL: Well developed, and nourished, appropriately responsive and aware without distress.   EYES: Sclera non-icteric.   EARS, NOSE, MOUTH AND THROAT: Mask worn.   Hearing is intact to voice.  NECK: Trachea is midline, and there is no jugular venous distension.  LYMPH NODES:  Lymph nodes in the neck are not enlarged. RESPIRATORY:  Lungs are clear, and breath sounds are equal bilaterally. Normal respiratory  effort without pathologic use of accessory muscles. CARDIOVASCULAR: Heart is regular in rate and rhythm. GI: The abdomen is soft, nontender, and nondistended. There were no palpable masses. I did not appreciate hepatosplenomegaly. There were normal bowel sounds. GU: There is a small opening in a scarred posterior perianal skin area that is draining a serosanguinous drainage.  There is a sense that there is a small cavity under the skin.  On digital exam I do not appreciate any remarkable mass or fullness in the posterior rectal region.  There is scarring anteriorly leaving an appreciable defect in the internal sphincter.  I do not appreciate any other induration around the anal sphincter or  surrounding soft tissues. MUSCULOSKELETAL:  Symmetrical muscle tone appreciated in all four extremities.    SKIN: Skin turgor is normal. No pathologic skin lesions appreciated.  NEUROLOGIC:  Motor and sensation appear grossly normal.  Cranial nerves are grossly without defect. PSYCH:  Alert and oriented to person, place and time. Affect is appropriate for situation.  Data Reviewed I have personally reviewed what is currently available of the patient's imaging, recent labs and medical records.   Labs:  No flowsheet data found.    Imaging:  Within last 24 hrs: No results found.  Assessment    Recurrent perianal abscess, posterior doubt involvement with pilonidal scarring. Question possible fistula in ano.  Patient Active Problem List   Diagnosis Date Noted  . Snoring 09/29/2016  . COPD, mild (HCC) 07/20/2016  . Benign hypertension 07/14/2016  . Cough, persistent 07/14/2016  . Hyperglycemia 07/14/2016  . Other hyperlipidemia 07/14/2016  . Peri-rectal abscess 02/05/2015    Plan    We will proceed with pelvic MRI to evaluate for possible fistulous tract communications, anticipate rectal exam under anesthesia next week.  In the interim advised utilization of Flagyl, continued sitz bath's and pain control.  He is utilizing ibuprofen primarily for pain at the present time.  Face-to-face time spent with the patient and accompanying care providers(if present) was 30 minutes, with more than 50% of the time spent counseling, educating, and coordinating care of the patient.      Campbell Lerner M.D., FACS 01/23/2020, 4:02 PM

## 2020-01-23 NOTE — H&P (View-Only) (Signed)
Patient ID: Manuel Dixon, male   DOB: 03/20/1976, 43 y.o.   MRN: [id number]  Chief Complaint: Recurrent perirectal/perianal abscess.  History of Present Illness Manuel Dixon is a 43 y.o. male with prior history of I&D of perirectal abscess over 3 years ago.  Recurring at the time of his wedding 21st of May.  Spontaneous drainage and resolution, now with recurrence of pain and buildup of pressure.  He now has begun draining over the last week,  as well still having sharp perianal/coccygeal sharp stabbing pain, "hot needle."  Somewhat reminiscent of his pilonidal surgery, and the loss of soft tissue in that region.  Reports mild fever, without chills, denies n/v.  Reports a bowel movement that showed some bloody discharge, concerned that he may be having some drainage internally as well.  Past Medical History Past Medical History:  Diagnosis Date  . COPD (chronic obstructive pulmonary disease) (HCC)   . Pneumonia 2015      Past Surgical History:  Procedure Laterality Date  . INCISION AND DRAINAGE PERIRECTAL ABSCESS N/A 02/11/2015   Procedure: IRRIGATION AND DEBRIDEMENT PERIRECTAL ABSCESS;  Surgeon: Jeffrey W Byrnett, MD;  Location: [facility] ORS;  Service: General;  Laterality: N/A;  . PILONIDAL CYST EXCISION  2014   Dr Smith    Allergies  Allergen Reactions  . Amoxil [Amoxicillin]   . Codeine Nausea Only  . Hydrocodone Nausea And Vomiting    Current Outpatient Medications  Medication Sig Dispense Refill  . albuterol (PROVENTIL HFA;VENTOLIN HFA) 108 (90 Base) MCG/ACT inhaler 2 puffs q.i.d. p.r.n. short of breath, wheezing, or cough    . losartan (COZAAR) 50 MG tablet Take 50 mg by mouth daily.     . tiotropium (SPIRIVA) 18 MCG inhalation capsule Place into inhaler and inhale as needed.      No current facility-administered medications for this visit.    Family History Family History  Problem Relation Age of Onset  . Heart attack Mother   . Colon cancer Neg Hx       Social  History Social History   Tobacco Use  . Smoking status: Current Every Day Smoker    Packs/day: 1.00    Years: 20.00    Pack years: 20.00    Types: Cigarettes  . Smokeless tobacco: Never Used  Substance Use Topics  . Alcohol use: Yes    Alcohol/week: 6.0 - 12.0 standard drinks    Types: 6 - 12 Standard drinks or equivalent per week    Comment: PT NORMALLY DOES DRINK BUT HAS HAD NO ETOH IN 2 WEEKS PER PT (02-10-15)  . Drug use: No        Review of Systems  All other systems reviewed and are negative.     Physical Exam Blood pressure (!) 155/93, pulse 99, temperature 97.7 F (36.5 C), temperature source Oral, resp. rate 12, height 5' 10" (1.778 m), weight 235 lb (106.6 kg), SpO2 96 %. Last Weight  Most recent update: 01/23/2020  3:44 PM   Weight  106.6 kg (235 lb)            CONSTITUTIONAL: Well developed, and nourished, appropriately responsive and aware without distress.   EYES: Sclera non-icteric.   EARS, NOSE, MOUTH AND THROAT: Mask worn.   Hearing is intact to voice.  NECK: Trachea is midline, and there is no jugular venous distension.  LYMPH NODES:  Lymph nodes in the neck are not enlarged. RESPIRATORY:  Lungs are clear, and breath sounds are equal bilaterally. Normal respiratory   effort without pathologic use of accessory muscles. CARDIOVASCULAR: Heart is regular in rate and rhythm. GI: The abdomen is soft, nontender, and nondistended. There were no palpable masses. I did not appreciate hepatosplenomegaly. There were normal bowel sounds. GU: There is a small opening in a scarred posterior perianal skin area that is draining a serosanguinous drainage.  There is a sense that there is a small cavity under the skin.  On digital exam I do not appreciate any remarkable mass or fullness in the posterior rectal region.  There is scarring anteriorly leaving an appreciable defect in the internal sphincter.  I do not appreciate any other induration around the anal sphincter or  surrounding soft tissues. MUSCULOSKELETAL:  Symmetrical muscle tone appreciated in all four extremities.    SKIN: Skin turgor is normal. No pathologic skin lesions appreciated.  NEUROLOGIC:  Motor and sensation appear grossly normal.  Cranial nerves are grossly without defect. PSYCH:  Alert and oriented to person, place and time. Affect is appropriate for situation.  Data Reviewed I have personally reviewed what is currently available of the patient's imaging, recent labs and medical records.   Labs:  No flowsheet data found.    Imaging:  Within last 24 hrs: No results found.  Assessment    Recurrent perianal abscess, posterior doubt involvement with pilonidal scarring. Question possible fistula in ano.  Patient Active Problem List   Diagnosis Date Noted  . Snoring 09/29/2016  . COPD, mild (HCC) 07/20/2016  . Benign hypertension 07/14/2016  . Cough, persistent 07/14/2016  . Hyperglycemia 07/14/2016  . Other hyperlipidemia 07/14/2016  . Peri-rectal abscess 02/05/2015    Plan    We will proceed with pelvic MRI to evaluate for possible fistulous tract communications, anticipate rectal exam under anesthesia next week.  In the interim advised utilization of Flagyl, continued sitz bath's and pain control.  He is utilizing ibuprofen primarily for pain at the present time.  Face-to-face time spent with the patient and accompanying care providers(if present) was 30 minutes, with more than 50% of the time spent counseling, educating, and coordinating care of the patient.      Andra Matsuo M.D., FACS 01/23/2020, 4:02 PM     

## 2020-01-24 ENCOUNTER — Telehealth: Payer: Self-pay

## 2020-01-24 ENCOUNTER — Telehealth: Payer: Self-pay | Admitting: Surgery

## 2020-01-24 NOTE — Telephone Encounter (Signed)
Pt has been advised of Pre-Admission date/time, COVID Testing date and Surgery date.  Surgery Date: 01/29/20 Preadmission Testing Date: 01/29/20 (office, patient to arrive 2 hours early) Covid Testing Date: 01/27/20 - [facility] [facility]) between 8a-1p   Patient has been made aware to arrive 2 hours early the day of surgery and voices understanding.

## 2020-01-24 NOTE — Telephone Encounter (Signed)
MRI Pelvis scheduled 01/25/20 @ 11:[facility]. Left message for patient to call office.

## 2020-01-24 NOTE — Telephone Encounter (Signed)
Error

## 2020-01-25 ENCOUNTER — Other Ambulatory Visit: Payer: Self-pay

## 2020-01-25 ENCOUNTER — Ambulatory Visit
Admission: RE | Admit: 2020-01-25 | Discharge: 2020-01-25 | Disposition: A | Discharge status: Home / Self Care | Payer: BC Managed Care – PPO | Source: Ambulatory Visit | Attending: Surgery | Admitting: Surgery

## 2020-01-25 DIAGNOSIS — K610 Anal abscess: Secondary | ICD-10-CM | POA: Diagnosis not present

## 2020-01-25 MED ORDER — GADOBUTROL 1 MMOL/ML IV SOLN
10.0000 mL | Freq: Once | INTRAVENOUS | Status: AC | PRN
  Administered 2020-01-25: 10 mL via INTRAVENOUS

## 2020-01-27 ENCOUNTER — Other Ambulatory Visit
Admission: RE | Admit: 2020-01-27 | Discharge: 2020-01-27 | Disposition: A | Discharge status: Home / Self Care | Payer: BC Managed Care – PPO | Source: Ambulatory Visit | Attending: Surgery | Admitting: Surgery

## 2020-01-27 ENCOUNTER — Other Ambulatory Visit: Payer: Self-pay

## 2020-01-27 DIAGNOSIS — Z01812 Encounter for preprocedural laboratory examination: Secondary | ICD-10-CM | POA: Insufficient documentation

## 2020-01-27 DIAGNOSIS — Z20822 Contact with and (suspected) exposure to covid-19: Secondary | ICD-10-CM | POA: Insufficient documentation

## 2020-01-28 LAB — SARS CORONAVIRUS 2 (TAT 6-24 HRS): SARS Coronavirus 2: NEGATIVE

## 2020-01-29 ENCOUNTER — Encounter
Admission: RE | Disposition: A | Discharge status: Home / Self Care | Payer: Self-pay | Source: Home / Self Care | Attending: Surgery

## 2020-01-29 ENCOUNTER — Ambulatory Visit: Payer: BC Managed Care – PPO | Admitting: Certified Registered"

## 2020-01-29 ENCOUNTER — Ambulatory Visit
Admission: RE | Admit: 2020-01-29 | Discharge: 2020-01-29 | Disposition: A | Discharge status: Home / Self Care | Payer: BC Managed Care – PPO | Attending: Surgery | Admitting: Surgery

## 2020-01-29 ENCOUNTER — Encounter: Payer: Self-pay | Admitting: Surgery

## 2020-01-29 ENCOUNTER — Ambulatory Visit: Payer: Self-pay | Admitting: Surgery

## 2020-01-29 DIAGNOSIS — Z885 Allergy status to narcotic agent status: Secondary | ICD-10-CM | POA: Insufficient documentation

## 2020-01-29 DIAGNOSIS — F17210 Nicotine dependence, cigarettes, uncomplicated: Secondary | ICD-10-CM | POA: Insufficient documentation

## 2020-01-29 DIAGNOSIS — K610 Anal abscess: Secondary | ICD-10-CM | POA: Diagnosis not present

## 2020-01-29 DIAGNOSIS — Z79899 Other long term (current) drug therapy: Secondary | ICD-10-CM | POA: Insufficient documentation

## 2020-01-29 DIAGNOSIS — L0501 Pilonidal cyst with abscess: Secondary | ICD-10-CM | POA: Diagnosis present

## 2020-01-29 DIAGNOSIS — J449 Chronic obstructive pulmonary disease, unspecified: Secondary | ICD-10-CM | POA: Insufficient documentation

## 2020-01-29 DIAGNOSIS — Z880 Allergy status to penicillin: Secondary | ICD-10-CM | POA: Diagnosis not present

## 2020-01-29 DIAGNOSIS — Z8249 Family history of ischemic heart disease and other diseases of the circulatory system: Secondary | ICD-10-CM | POA: Insufficient documentation

## 2020-01-29 HISTORY — PX: PILONIDAL CYST EXCISION: SHX744

## 2020-01-29 SURGERY — EXAM UNDER ANESTHESIA
Anesthesia: General | Site: Buttocks

## 2020-01-29 MED ORDER — CHLORHEXIDINE GLUCONATE 0.12 % MT SOLN: 15.0000 mL | Freq: Once | OROMUCOSAL | Status: AC

## 2020-01-29 MED ORDER — CELECOXIB 200 MG PO CAPS: 200.0000 mg | ORAL_CAPSULE | ORAL | Status: AC

## 2020-01-29 MED ORDER — LACTATED RINGERS IV SOLN: INTRAVENOUS | Status: DC

## 2020-01-29 MED ORDER — BUPIVACAINE LIPOSOME 1.3 % IJ SUSP: 20.0000 mL | Freq: Once | INTRAMUSCULAR | Status: DC

## 2020-01-29 MED ORDER — CHLORHEXIDINE GLUCONATE 0.12 % MT SOLN
OROMUCOSAL | Status: AC
  Administered 2020-01-29: 15 mL via OROMUCOSAL
  Filled 2020-01-29: qty 15

## 2020-01-29 MED ORDER — PROPOFOL 10 MG/ML IV BOLUS
INTRAVENOUS | Status: DC | PRN
  Administered 2020-01-29: 200 mg via INTRAVENOUS

## 2020-01-29 MED ORDER — METHYLENE BLUE 0.5 % INJ SOLN
INTRAVENOUS | Status: DC | PRN
  Administered 2020-01-29: 10 mL via SUBMUCOSAL

## 2020-01-29 MED ORDER — FENTANYL CITRATE (PF) 100 MCG/2ML IJ SOLN
INTRAMUSCULAR | Status: AC
  Filled 2020-01-29: qty 2

## 2020-01-29 MED ORDER — IBUPROFEN 800 MG PO TABS
800.0000 mg | ORAL_TABLET | Freq: Three times a day (TID) | ORAL | 0 refills | Status: DC | PRN
Start: 2020-01-29 — End: 2021-03-03

## 2020-01-29 MED ORDER — ONDANSETRON HCL 4 MG/2ML IJ SOLN: 4.0000 mg | Freq: Once | INTRAMUSCULAR | Status: DC | PRN

## 2020-01-29 MED ORDER — SUCCINYLCHOLINE CHLORIDE 20 MG/ML IJ SOLN
INTRAMUSCULAR | Status: DC | PRN
  Administered 2020-01-29: 120 mg via INTRAVENOUS

## 2020-01-29 MED ORDER — SUGAMMADEX SODIUM 200 MG/2ML IV SOLN
INTRAVENOUS | Status: DC | PRN
  Administered 2020-01-29: 200 mg via INTRAVENOUS

## 2020-01-29 MED ORDER — ORAL CARE MOUTH RINSE: 15.0000 mL | Freq: Once | OROMUCOSAL | Status: AC

## 2020-01-29 MED ORDER — ONDANSETRON HCL 4 MG/2ML IJ SOLN
INTRAMUSCULAR | Status: AC
  Filled 2020-01-29: qty 2

## 2020-01-29 MED ORDER — ACETAMINOPHEN 500 MG PO TABS
ORAL_TABLET | ORAL | Status: AC
  Administered 2020-01-29: 1000 mg via ORAL
  Filled 2020-01-29: qty 2

## 2020-01-29 MED ORDER — GABAPENTIN 300 MG PO CAPS
ORAL_CAPSULE | ORAL | Status: AC
  Administered 2020-01-29: 300 mg via ORAL
  Filled 2020-01-29: qty 1

## 2020-01-29 MED ORDER — ACETAMINOPHEN 500 MG PO TABS: 1000.0000 mg | ORAL_TABLET | ORAL | Status: AC

## 2020-01-29 MED ORDER — FENTANYL CITRATE (PF) 100 MCG/2ML IJ SOLN: 25.0000 ug | INTRAMUSCULAR | Status: DC | PRN

## 2020-01-29 MED ORDER — METHYLENE BLUE 0.5 % INJ SOLN
INTRAVENOUS | Status: AC
  Filled 2020-01-29: qty 10

## 2020-01-29 MED ORDER — BUPIVACAINE HCL 0.25 % IJ SOLN
INTRAMUSCULAR | Status: DC | PRN
  Administered 2020-01-29: 26 mL

## 2020-01-29 MED ORDER — HYDROGEN PEROXIDE 3 % EX SOLN
CUTANEOUS | Status: DC | PRN
  Administered 2020-01-29: 1

## 2020-01-29 MED ORDER — ROCURONIUM BROMIDE 10 MG/ML (PF) SYRINGE
PREFILLED_SYRINGE | INTRAVENOUS | Status: AC
  Filled 2020-01-29: qty 10

## 2020-01-29 MED ORDER — ACETAMINOPHEN 500 MG PO TABS
ORAL_TABLET | ORAL | Status: AC
  Filled 2020-01-29: qty 1

## 2020-01-29 MED ORDER — FENTANYL CITRATE (PF) 100 MCG/2ML IJ SOLN
INTRAMUSCULAR | Status: DC | PRN
  Administered 2020-01-29: 100 ug via INTRAVENOUS

## 2020-01-29 MED ORDER — CELECOXIB 200 MG PO CAPS
ORAL_CAPSULE | ORAL | Status: AC
  Administered 2020-01-29: 200 mg via ORAL
  Filled 2020-01-29: qty 1

## 2020-01-29 MED ORDER — LIDOCAINE HCL (PF) 2 % IJ SOLN
INTRAMUSCULAR | Status: AC
  Filled 2020-01-29: qty 5

## 2020-01-29 MED ORDER — CHLORHEXIDINE GLUCONATE CLOTH 2 % EX PADS: 6.0000 | MEDICATED_PAD | Freq: Once | CUTANEOUS | Status: DC

## 2020-01-29 MED ORDER — GABAPENTIN 300 MG PO CAPS: 300.0000 mg | ORAL_CAPSULE | ORAL | Status: AC

## 2020-01-29 MED ORDER — MIDAZOLAM HCL 2 MG/2ML IJ SOLN
INTRAMUSCULAR | Status: AC
  Filled 2020-01-29: qty 2

## 2020-01-29 MED ORDER — ROCURONIUM BROMIDE 100 MG/10ML IV SOLN
INTRAVENOUS | Status: DC | PRN
  Administered 2020-01-29: 30 mg via INTRAVENOUS
  Administered 2020-01-29: 20 mg via INTRAVENOUS

## 2020-01-29 MED ORDER — MIDAZOLAM HCL 2 MG/2ML IJ SOLN
INTRAMUSCULAR | Status: DC | PRN
  Administered 2020-01-29: 2 mg via INTRAVENOUS

## 2020-01-29 MED ORDER — FAMOTIDINE 20 MG PO TABS: 20.0000 mg | ORAL_TABLET | Freq: Once | ORAL | Status: AC

## 2020-01-29 MED ORDER — LIDOCAINE HCL (CARDIAC) PF 100 MG/5ML IV SOSY
PREFILLED_SYRINGE | INTRAVENOUS | Status: DC | PRN
  Administered 2020-01-29: 100 mg via INTRAVENOUS

## 2020-01-29 MED ORDER — PROPOFOL 10 MG/ML IV BOLUS
INTRAVENOUS | Status: AC
  Filled 2020-01-29: qty 20

## 2020-01-29 MED ORDER — ONDANSETRON HCL 4 MG/2ML IJ SOLN
INTRAMUSCULAR | Status: DC | PRN
  Administered 2020-01-29: 4 mg via INTRAVENOUS

## 2020-01-29 MED ORDER — FAMOTIDINE 20 MG PO TABS
ORAL_TABLET | ORAL | Status: AC
  Administered 2020-01-29: 20 mg via ORAL
  Filled 2020-01-29: qty 1

## 2020-01-29 SURGICAL SUPPLY — 26 items
BLADE SURG 15 STRL LF DISP TIS (BLADE) ×1 IMPLANT
BLADE SURG 15 STRL SS (BLADE) ×2
BRIEF STRETCH MATERNITY 2XLG (MISCELLANEOUS) ×3 IMPLANT
CANISTER SUCT 1200ML W/VALVE (MISCELLANEOUS) ×3 IMPLANT
COVER WAND RF STERILE (DRAPES) ×3 IMPLANT
DRAPE LAPAROTOMY 100X77 ABD (DRAPES) ×3 IMPLANT
DRSG GAUZE FLUFF 36X18 (GAUZE/BANDAGES/DRESSINGS) ×3 IMPLANT
ELECT CAUTERY BLADE TIP 2.5 (TIP) ×3
ELECT REM PT RETURN 9FT ADLT (ELECTROSURGICAL) ×3
ELECTRODE CAUTERY BLDE TIP 2.5 (TIP) ×1 IMPLANT
ELECTRODE REM PT RTRN 9FT ADLT (ELECTROSURGICAL) ×1 IMPLANT
GLOVE ORTHO TXT STRL SZ7.5 (GLOVE) ×3 IMPLANT
GOWN STRL REUS W/ TWL LRG LVL3 (GOWN DISPOSABLE) ×2 IMPLANT
GOWN STRL REUS W/TWL LRG LVL3 (GOWN DISPOSABLE) ×4
KIT TURNOVER KIT A (KITS) ×3 IMPLANT
NEEDLE HYPO 22GX1.5 SAFETY (NEEDLE) ×3 IMPLANT
PACK BASIN MINOR (MISCELLANEOUS) ×3 IMPLANT
PAD ABD DERMACEA PRESS 5X9 (GAUZE/BANDAGES/DRESSINGS) IMPLANT
SHEARS HARMONIC 9CM CVD (BLADE) IMPLANT
SOL PREP PVP 2OZ (MISCELLANEOUS) ×3
SOLUTION PREP PVP 2OZ (MISCELLANEOUS) ×1 IMPLANT
SURGILUBE 2OZ TUBE FLIPTOP (MISCELLANEOUS) ×3 IMPLANT
SUT CHROMIC 3 0 SH 27 (SUTURE) ×3 IMPLANT
SWABSTK COMLB BENZOIN TINCTURE (MISCELLANEOUS) ×3 IMPLANT
SYR 10ML LL (SYRINGE) ×3 IMPLANT
TAPE CLOTH 3X10 WHT NS LF (GAUZE/BANDAGES/DRESSINGS) ×6 IMPLANT

## 2020-01-29 NOTE — Discharge Instructions (Signed)

## 2020-01-29 NOTE — Op Note (Signed)
Rectal examination under anesthesia, excision of recurrent pilonidal disease.  Pre-operative Diagnosis: Recurrent pilonidal disease.  Post-operative Diagnosis: same.    Surgeon: Campbell Lerner, M.D., FACS  Anesthesia: General  Findings: Consistent with MRI findings, single tunnel tract subcutaneous/soft tissues extra coccygeal, without involvement of the anal sphincter or perirectal tissues.  Estimated Blood Loss: 5 mL         Specimens: Overlying skin and cicatrix, discarded.          Complications: none              Procedure Details  The patient was seen again in the Holding Room. The benefits, complications, treatment options, and expected outcomes were discussed with the patient. The risks of bleeding, infection, recurrence of symptoms, failure to resolve symptoms, unanticipated injury, prosthetic placement, prosthetic infection, any of which could require further surgery were reviewed with the patient. The likelihood of improving the patient's symptoms with return to their baseline status is good.  The patient and/or family concurred with the proposed plan, giving informed consent.  The patient was taken to Operating Room, identified and the procedure verified.    Prior to the induction of general anesthesia, antibiotic prophylaxis was administered. VTE prophylaxis was in place.  General was then administered and tolerated well. After the induction, the patient was positioned in the prone position and the perianal/sacral region was prepped with Betadine and draped in the sterile fashion.  A Time Out was held and the above information confirmed. A one-to-one mixture of methylene blue and hydrogen peroxide was utilized to instill into the pilonidal disease sinus tracts utilizing a 20-gauge Angiocath.  This confirmed the additional medication and an additional fine pit.  Utilizing a grooved director with a probe, I was able to lay open this tract and explored thoroughly.  The overlying  skin that was involved with cicatrix was sharply excised for assistance with wound care.  There was no communication with the underlying coccyx which was significantly distant, his coccyx appears to be oriented quite anteriorly, before prepping the patient I did a digital examination which confirmed the location of the coccyx anteriorly, the absence of perirectal induration or mass, and evaluate the anal sphincteric mechanism.  There is a notch anteriorly of the exterior most aspect of the internal sphincter which I did note in the office. Once all of the loose soft tissues were excised, then confirmed adequate hemostasis with electrocautery.  I infiltrated the region with quarter percent Marcaine.  And packing the wound with quarter inch packing strip wet with the local anesthetic.  We then placed a Vaseline gauze over this, followed by fluffs and ABD pad secured. The size of the incision was approximately 3 cm cephalad to caudad approximately 1 cm wide and approximately a depth of 8 mm.  He tolerated procedure well.      Campbell Lerner M.D., [facility] 01/29/2020 12:19 PM

## 2020-01-29 NOTE — Transfer of Care (Signed)
Immediate Anesthesia Transfer of Care Note  Patient: Manuel Dixon  Procedure(s) Performed: Francia Greaves UNDER ANESTHESIA, EXCISION RECURRENT PILONIDAL DISEASE (N/A Buttocks)  Patient Location: PACU  Anesthesia Type:General  Level of Consciousness: awake, alert  and oriented  Airway & Oxygen Therapy: Patient Spontanous Breathing  Post-op Assessment: Report given to RN and Post -op Vital signs reviewed and stable  Post vital signs: Reviewed and stable  Last Vitals:  Vitals Value Taken Time  BP 133/89 01/29/20 1232  Temp 36 C 01/29/20 1226  Pulse 87 01/29/20 1233  Resp 13 01/29/20 1233  SpO2 97 % 01/29/20 1233  Vitals shown include unvalidated device data.  Last Pain:  Vitals:   01/29/20 1226  TempSrc:   PainSc: 0-No pain         Complications: No complications documented.

## 2020-01-29 NOTE — Anesthesia Preprocedure Evaluation (Signed)
Anesthesia Evaluation  Patient identified by MRN, date of birth, ID band Patient awake    Reviewed: Allergy & Precautions, NPO status , Patient's Chart, lab work & pertinent test results  Airway Mallampati: II  TM Distance: >3 FB Neck ROM: Full    Dental no notable dental hx.    Pulmonary pneumonia, resolved, COPD,  COPD inhaler, Current Smoker and Patient abstained from smoking.,    Pulmonary exam normal breath sounds clear to auscultation       Cardiovascular negative cardio ROS Normal cardiovascular exam     Neuro/Psych negative neurological ROS  negative psych ROS   GI/Hepatic Neg liver ROS, Perirectal abcess   Endo/Other  negative endocrine ROS  Renal/GU negative Renal ROS  negative genitourinary   Musculoskeletal negative musculoskeletal ROS (+)   Abdominal Normal abdominal exam  (+)   Peds negative pediatric ROS (+)  Hematology negative hematology ROS (+)   Anesthesia Other Findings   Reproductive/Obstetrics                             Anesthesia Physical  Anesthesia Plan  ASA: II  Anesthesia Plan: General   Post-op Pain Management:    Induction: Intravenous  PONV Risk Score and Plan:   Airway Management Planned: Oral ETT and Video Laryngoscope Planned  Additional Equipment:   Intra-op Plan:   Post-operative Plan: Extubation in OR  Informed Consent: I have reviewed the patients History and Physical, chart, labs and discussed the procedure including the risks, benefits and alternatives for the proposed anesthesia with the patient or authorized representative who has indicated his/her understanding and acceptance.     Dental advisory given  Plan Discussed with: CRNA and Surgeon  Anesthesia Plan Comments:         Anesthesia Quick Evaluation

## 2020-01-29 NOTE — Interval H&P Note (Signed)
History and Physical Interval Note:  01/29/2020 10:59 AM  Manuel Dixon  has presented today for surgery, with the diagnosis of Peri-rectal abscess.  The various methods of treatment have been discussed with the patient and family.   MRI reveals no anorectal fistula present.  Presenting wound appears to be consistent with recurrent pilonidal disease or external perianal abscess.  After consideration of risks, benefits and other options for treatment, the patient has consented to  Procedure(s): EXAM UNDER ANESTHESIA, Rectal (N/A) and incision and/excisional debridement of recurrent pilonidal disease as a surgical intervention.  The patient's history has been reviewed, patient examined, no change in status, stable for surgery.  I have reviewed the patient's chart and labs.  Questions were answered to the patient's satisfaction.     Campbell Lerner

## 2020-01-29 NOTE — Anesthesia Postprocedure Evaluation (Signed)
Anesthesia Post Note  Patient: Manuel Dixon  Procedure(s) Performed: Francia Greaves UNDER ANESTHESIA, EXCISION RECURRENT PILONIDAL DISEASE (N/A Buttocks)  Patient location during evaluation: PACU Anesthesia Type: General Level of consciousness: awake and alert and oriented Pain management: pain level controlled Vital Signs Assessment: post-procedure vital signs reviewed and stable Respiratory status: spontaneous breathing Cardiovascular status: blood pressure returned to baseline Anesthetic complications: no   No complications documented.   Last Vitals:  Vitals:   01/29/20 1311 01/29/20 1317  BP: (!) 124/99 127/85  Pulse: 74 70  Resp: 16 18  Temp: (!) 36.2 C (!) 36.4 C  SpO2: 100% 98%    Last Pain:  Vitals:   01/29/20 1317  TempSrc: Temporal  PainSc:                  [name]

## 2020-01-29 NOTE — Anesthesia Procedure Notes (Addendum)
Procedure Name: Intubation Date/Time: 01/29/2020 11:35 AM Performed by: Karoline Caldwell, CRNA Pre-anesthesia Checklist: Patient identified, Emergency Drugs available, Suction available and Patient being monitored Patient Re-evaluated:Patient Re-evaluated prior to induction Oxygen Delivery Method: Circle system utilized Preoxygenation: Pre-oxygenation with 100% oxygen Induction Type: IV induction Ventilation: Mask ventilation without difficulty, Oral airway inserted - appropriate to patient size and Two handed mask ventilation required Tube type: Oral Number of attempts: 1 Airway Equipment and Method: Stylet,  Oral airway and Video-laryngoscopy Placement Confirmation: ETT inserted through vocal cords under direct vision,  positive ETCO2 and breath sounds checked- equal and bilateral Secured at: 22 cm Tube secured with: Tape Dental Injury: Teeth and Oropharynx as per pre-operative assessment

## 2020-01-30 ENCOUNTER — Telehealth: Payer: Self-pay | Admitting: *Deleted

## 2020-01-30 ENCOUNTER — Other Ambulatory Visit: Payer: Self-pay | Admitting: Surgery

## 2020-01-30 MED ORDER — OXYCODONE-ACETAMINOPHEN 7.5-325 MG PO TABS: 1.0000 | ORAL_TABLET | ORAL | 0 refills | Status: DC | PRN

## 2020-01-30 NOTE — Telephone Encounter (Signed)
Spoke with patient to let him know Percocet would be sent to pharmacy per Dr.Rodenberg.

## 2020-01-30 NOTE — Telephone Encounter (Signed)
Patient called and stated that he had surgery yesterday with Dr Claudine Mouton and he is in some pain and would like to see if he can get some pain medication. He stated that he can not take Codeine or hydrocodone.

## 2020-01-30 NOTE — Progress Notes (Signed)
Ibuprofen insufficient for pain control.

## 2020-01-30 NOTE — Telephone Encounter (Signed)
Spoke with patient-he took 2- 800 mg ibuprofen 10:30 am -no relief- he has not tried using tylenol- he has used Tramadol and percocet in the past with good results.  In the meantime he was told to try tylenol until I could speak with Dr.Rodenberg.

## 2020-01-30 NOTE — Telephone Encounter (Signed)
The patient is allergic to Codeine & Hydrocodone. Message sent to Dr Claudine Mouton regarding this.

## 2020-02-05 ENCOUNTER — Other Ambulatory Visit: Payer: Self-pay

## 2020-02-05 ENCOUNTER — Encounter: Payer: Self-pay | Admitting: Physician Assistant

## 2020-02-05 ENCOUNTER — Encounter: Payer: BC Managed Care – PPO | Admitting: Physician Assistant

## 2020-02-05 ENCOUNTER — Ambulatory Visit (INDEPENDENT_AMBULATORY_CARE_PROVIDER_SITE_OTHER): Payer: Self-pay | Admitting: Physician Assistant

## 2020-02-05 ENCOUNTER — Telehealth: Payer: Self-pay | Admitting: *Deleted

## 2020-02-05 ENCOUNTER — Telehealth: Payer: Self-pay | Admitting: Surgery

## 2020-02-05 VITALS — BP 122/85 | HR 79 | Temp 97.5°F | Ht 70.0 in | Wt 241.0 lb

## 2020-02-05 DIAGNOSIS — K610 Anal abscess: Secondary | ICD-10-CM

## 2020-02-05 DIAGNOSIS — Z09 Encounter for follow-up examination after completed treatment for conditions other than malignant neoplasm: Secondary | ICD-10-CM

## 2020-02-05 NOTE — Telephone Encounter (Signed)
Spoke with patient's wife, she states during patient's dressing change yesterday evening she noticed yellow drainage and a foul odor on the packing, patient denied having any fever or chills. Patient was informed that Dr.Rodenberg was out of the office today and would return tomorrow, if patient would like to come in for an appointment. Patient requested if patient could be seen today due to her having to work tomorrow. I informed her that I would sent a message to our PA and Dr.Rodenberg to make sure it was OK. Laqueta Due, PA states he would see the patient this afternoon during clinic. Patient wife was notified and has scheduled appointment this afternoon at 4pm with Laqueta Due, PA. Patient wife's verbalized understanding and has no further questions.

## 2020-02-05 NOTE — Patient Instructions (Addendum)
Otho Ket, PA recommends Tylenol or Ibuprofen to help with pain management.  Advised to not use the Saline on the gauze. Patient was given Iodform Packing at today's visit.  Wound Care, Adult Taking care of your wound properly can help to prevent pain, infection, and scarring. It can also help your wound to heal more quickly. How to care for your wound Wound care      Follow instructions from your health care provider about how to take care of your wound. Make sure you: ? Wash your hands with soap and water before you change the bandage (dressing). If soap and water are not available, use hand sanitizer. ? Change your dressing as told by your health care provider. ? Leave stitches (sutures), skin glue, or adhesive strips in place. These skin closures may need to stay in place for 2 weeks or longer. If adhesive strip edges start to loosen and curl up, you may trim the loose edges. Do not remove adhesive strips completely unless your health care provider tells you to do that.  Check your wound area every day for signs of infection. Check for: ? Redness, swelling, or pain. ? Fluid or blood. ? Warmth. ? Pus or a bad smell.  Ask your health care provider if you should clean the wound with mild soap and water. Doing this may include: ? Using a clean towel to pat the wound dry after cleaning it. Do not rub or scrub the wound. ? Applying a cream or ointment. Do this only as told by your health care provider. ? Covering the incision with a clean dressing.  Ask your health care provider when you can leave the wound uncovered.  Keep the dressing dry until your health care provider says it can be removed. Do not take baths, swim, use a hot tub, or do anything that would put the wound underwater until your health care provider approves. Ask your health care provider if you can take showers. You may only be allowed to take sponge baths. Medicines   If you were prescribed an antibiotic medicine,  cream, or ointment, take or use the antibiotic as told by your health care provider. Do not stop taking or using the antibiotic even if your condition improves.  Take over-the-counter and prescription medicines only as told by your health care provider. If you were prescribed pain medicine, take it 30 or more minutes before you do any wound care or as told by your health care provider. General instructions  Return to your normal activities as told by your health care provider. Ask your health care provider what activities are safe.  Do not scratch or pick at the wound.  Do not use any products that contain nicotine or tobacco, such as cigarettes and e-cigarettes. These may delay wound healing. If you need help quitting, ask your health care provider.  Keep all follow-up visits as told by your health care provider. This is important.  Eat a diet that includes protein, vitamin A, vitamin C, and other nutrient-rich foods to help the wound heal. ? Foods rich in protein include meat, dairy, beans, nuts, and other sources. ? Foods rich in vitamin A include carrots and dark green, leafy vegetables. ? Foods rich in vitamin C include citrus, tomatoes, and other fruits and vegetables. ? Nutrient-rich foods have protein, carbohydrates, fat, vitamins, or minerals. Eat a variety of healthy foods including vegetables, fruits, and whole grains. Contact a health care provider if:  You received a tetanus shot and  you have swelling, severe pain, redness, or bleeding at the injection site.  Your pain is not controlled with medicine.  You have redness, swelling, or pain around the wound.  You have fluid or blood coming from the wound.  Your wound feels warm to the touch.  You have pus or a bad smell coming from the wound.  You have a fever or chills.  You are nauseous or you vomit.  You are dizzy. Get help right away if:  You have a red streak going away from your wound.  The edges of the wound  open up and separate.  Your wound is bleeding, and the bleeding does not stop with gentle pressure.  You have a rash.  You faint.  You have trouble breathing. Summary  Always wash your hands with soap and water before changing your bandage (dressing).  To help with healing, eat foods that are rich in protein, vitamin A, vitamin C, and other nutrients.  Check your wound every day for signs of infection. Contact your health care provider if you suspect that your wound is infected. This information is not intended to replace advice given to you by your health care provider. Make sure you discuss any questions you have with your health care provider. Document Revised: 11/19/2018 Document Reviewed: 02/16/2016 Elsevier Patient Education  2020 [employer].

## 2020-02-05 NOTE — Telephone Encounter (Signed)
Faxed FMLA to [employer] at [phone] 

## 2020-02-05 NOTE — Progress Notes (Signed)
[facility] POST-OP OFFICE VISIT  02/05/2020  HPI: Manuel Dixon is a 44 y.o. male 7 days s/p excision of recurrent pilonidal disease with Dr Claudine Mouton.   He presents today for follow after noticing what his wife described as yellow drainage with the dressing change yesterday and was concerned it was infected. He is reporting intermittent pain in the area of his excision, this comes and goes sporadically. No fever or chills. Does not need any additional pain control.   Vital signs: BP 122/85   Pulse 79   Temp (!) 97.5 F (36.4 C) (Temporal)   Ht 5\' 10"  (1.778 m)   Wt 241 lb (109.3 kg)   SpO2 97%   BMI 34.58 kg/m    Physical Exam: Constitutional: Well appearing male, NAD Skin: Excision debridement to the superior gluteal cleft, wound bed with healthy granular tissue, I do not appreciate any surrounding erythema or drainage, no evidence of finfection  Assessment/Plan: This is a 44 y.o. male 7 days s/p excision of recurrent pilonidal disease.   - Pain control with tylenol + motrin; oxycodone for dressing changes  - I reviewed dressing changes with wife, and preformed one at bedside  - I do not think he needs any ABx at this time  - continue with scheduled follow up in 2 weeks with Dr 45   -- Claudine Mouton, PA-C [facility] 02/05/2020, 1:53 PM [phone] M-F: 7am - 4pm

## 2020-02-05 NOTE — Telephone Encounter (Signed)
Patient is calling and is asking if someone could give him a call patient said he recently had surgery with Dr. Ranelle Oyster and said he believes one of his incision sites is infected. Please call patient and advise.

## 2020-02-18 ENCOUNTER — Encounter: Payer: BC Managed Care – PPO | Admitting: Surgery

## 2020-02-18 ENCOUNTER — Encounter: Payer: Self-pay | Admitting: Surgery

## 2020-02-18 ENCOUNTER — Other Ambulatory Visit: Payer: Self-pay

## 2020-02-18 ENCOUNTER — Ambulatory Visit (INDEPENDENT_AMBULATORY_CARE_PROVIDER_SITE_OTHER): Payer: Self-pay | Admitting: Surgery

## 2020-02-18 VITALS — BP 171/147 | HR 103 | Temp 93.2°F | Ht 70.0 in | Wt 236.2 lb

## 2020-02-18 DIAGNOSIS — Z09 Encounter for follow-up examination after completed treatment for conditions other than malignant neoplasm: Secondary | ICD-10-CM

## 2020-02-18 NOTE — Progress Notes (Signed)
[facility] POST-OP OFFICE VISIT  02/18/2020  HPI: Manuel Dixon is a 44 y.o. male 3 weeks s/p excision pilonidal disease.  Last pack Saturday.  Reports drainage, primarily bloody.  Denies fevers and chills.  Still has remarkable tenderness to the area, reports bumping it against cabinetry not too long ago and nearly blacked out from pain. Vital signs: BP (!) 171/147   Pulse (!) 103   Temp (!) 93.2 F (34 C)   Ht 5\' 10"  (1.778 m)   Wt 236 lb 3.2 oz (107.1 kg)   SpO2 96%   BMI 33.89 kg/m    Physical Exam: Constitutional: Appears well, no distress, nontoxic.  Skin: The freshly healed perimeter of the natal cleft wound is slightly macerated, there is early formation of a few webs, I do not detect any retained foreign body or hair present in the wound.  It is 100% granulated and it appears that there is epidermis groin down onto the sides of it.  There is no proud flesh for me to cauterize.  I probed the wound deeply to ensure there is no persistent webs or nooks or crannies.  I found no cavitation.  Assessment/Plan: This is a 44 y.o. male 3 weeks s/p excision of pilonidal disease.  Patient Active Problem List   Diagnosis Date Noted  . Snoring 09/29/2016  . COPD, mild (HCC) 07/20/2016  . Benign hypertension 07/14/2016  . Cough, persistent 07/14/2016  . Hyperglycemia 07/14/2016  . Other hyperlipidemia 07/14/2016  . Peri-rectal abscess 02/05/2015    -We reviewed wound care, avoiding utilization of packing, but encouraging a dry state to encourage epidermal ingrowth.  Prevention still involves preventing premature webbing or side to side healing.  Encouraged him to utilize as much aggressive extraction of the edges, mechanical cleansing and maintenance of dryness to the area.  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  And I will keep him out for another month till I reevaluate his wounds progress.   Personnel officer M.D., FACS 02/18/2020, 10:09 AM

## 2020-02-18 NOTE — Patient Instructions (Signed)
Try to keep this area dry and open as much as possible. Pull you buttocks apart in the shower to let the water run over the area.  Just use a dry gauze over the area.   Stay out of work until follow up here next month.   You can follow up here in one month.

## 2020-03-24 ENCOUNTER — Ambulatory Visit (INDEPENDENT_AMBULATORY_CARE_PROVIDER_SITE_OTHER): Payer: BC Managed Care – PPO | Admitting: Surgery

## 2020-03-24 ENCOUNTER — Other Ambulatory Visit: Payer: Self-pay

## 2020-03-24 ENCOUNTER — Encounter: Payer: Self-pay | Admitting: Surgery

## 2020-03-24 VITALS — BP 175/128 | HR 80 | Temp 98.3°F | Ht 70.0 in | Wt 232.8 lb

## 2020-03-24 DIAGNOSIS — K610 Anal abscess: Secondary | ICD-10-CM

## 2020-03-24 MED ORDER — METRONIDAZOLE 500 MG PO TABS
500.0000 mg | ORAL_TABLET | Freq: Three times a day (TID) | ORAL | 0 refills | Status: AC
Start: 2020-03-24 — End: 2020-04-03

## 2020-03-24 MED ORDER — CIPROFLOXACIN HCL 500 MG PO TABS
500.0000 mg | ORAL_TABLET | Freq: Two times a day (BID) | ORAL | 0 refills | Status: AC
Start: 2020-03-24 — End: 2020-04-03

## 2020-03-24 NOTE — Patient Instructions (Addendum)
Dr.Rodenberg prescribed Augmentin Antibiotic treatment for a week and advised patient to try Sitz Baths.  Wound Care, Adult Taking care of your wound properly can help to prevent pain, infection, and scarring. It can also help your wound to heal more quickly. How to care for your wound Wound care      Follow instructions from your health care provider about how to take care of your wound. Make sure you: ? Wash your hands with soap and water before you change the bandage (dressing). If soap and water are not available, use hand sanitizer. ? Change your dressing as told by your health care provider. ? Leave stitches (sutures), skin glue, or adhesive strips in place. These skin closures may need to stay in place for 2 weeks or longer. If adhesive strip edges start to loosen and curl up, you may trim the loose edges. Do not remove adhesive strips completely unless your health care provider tells you to do that.  Check your wound area every day for signs of infection. Check for: ? Redness, swelling, or pain. ? Fluid or blood. ? Warmth. ? Pus or a bad smell.  Ask your health care provider if you should clean the wound with mild soap and water. Doing this may include: ? Using a clean towel to pat the wound dry after cleaning it. Do not rub or scrub the wound. ? Applying a cream or ointment. Do this only as told by your health care provider. ? Covering the incision with a clean dressing.  Ask your health care provider when you can leave the wound uncovered.  Keep the dressing dry until your health care provider says it can be removed. Do not take baths, swim, use a hot tub, or do anything that would put the wound underwater until your health care provider approves. Ask your health care provider if you can take showers. You may only be allowed to take sponge baths. Medicines   If you were prescribed an antibiotic medicine, cream, or ointment, take or use the antibiotic as told by your health  care provider. Do not stop taking or using the antibiotic even if your condition improves.  Take over-the-counter and prescription medicines only as told by your health care provider. If you were prescribed pain medicine, take it 30 or more minutes before you do any wound care or as told by your health care provider. General instructions  Return to your normal activities as told by your health care provider. Ask your health care provider what activities are safe.  Do not scratch or pick at the wound.  Do not use any products that contain nicotine or tobacco, such as cigarettes and e-cigarettes. These may delay wound healing. If you need help quitting, ask your health care provider.  Keep all follow-up visits as told by your health care provider. This is important.  Eat a diet that includes protein, vitamin A, vitamin C, and other nutrient-rich foods to help the wound heal. ? Foods rich in protein include meat, dairy, beans, nuts, and other sources. ? Foods rich in vitamin A include carrots and dark green, leafy vegetables. ? Foods rich in vitamin C include citrus, tomatoes, and other fruits and vegetables. ? Nutrient-rich foods have protein, carbohydrates, fat, vitamins, or minerals. Eat a variety of healthy foods including vegetables, fruits, and whole grains. Contact a health care provider if:  You received a tetanus shot and you have swelling, severe pain, redness, or bleeding at the injection site.  Your pain  is not controlled with medicine.  You have redness, swelling, or pain around the wound.  You have fluid or blood coming from the wound.  Your wound feels warm to the touch.  You have pus or a bad smell coming from the wound.  You have a fever or chills.  You are nauseous or you vomit.  You are dizzy. Get help right away if:  You have a red streak going away from your wound.  The edges of the wound open up and separate.  Your wound is bleeding, and the bleeding does  not stop with gentle pressure.  You have a rash.  You faint.  You have trouble breathing. Summary  Always wash your hands with soap and water before changing your bandage (dressing).  To help with healing, eat foods that are rich in protein, vitamin A, vitamin C, and other nutrients.  Check your wound every day for signs of infection. Contact your health care provider if you suspect that your wound is infected. This information is not intended to replace advice given to you by your health care provider. Make sure you discuss any questions you have with your health care provider. Document Revised: 11/19/2018 Document Reviewed: 02/16/2016 Elsevier Patient Education  2020 [employer].   How to Take a [employer] A sitz bath is a warm water bath that may be used to care for your rectum, genital area, or the area between your rectum and genitals (perineum). For a sitz bath, the water only comes up to your hips and covers your buttocks. A sitz bath may done at home in a bathtub or with a portable sitz bath that fits over the toilet. Your health care provider may recommend a sitz bath to help:  Relieve pain and discomfort after delivering a baby.  Relieve pain and itching from hemorrhoids or anal fissures.  Relieve pain after certain surgeries.  Relax muscles that are sore or tight. How to take a sitz bath Take 3-4 sitz baths a day, or as many as told by your health care provider. Bathtub sitz bath To take a sitz bath in a bathtub: 1. Partially fill a bathtub with warm water. The water should be deep enough to cover your hips and buttocks when you are sitting in the tub. 2. If your health care provider told you to put medicine in the water, follow his or her instructions. 3. Sit in the water. 4. Open the tub drain a little, and leave it open during your bath. 5. Turn on the warm water again, enough to replace the water that is draining out. Keep the water running throughout your bath.  This helps keep the water at the right level and the right temperature. 6. Soak in the water for 15-20 minutes, or as long as told by your health care provider. 7. When you are done, be careful when you stand up. You may feel dizzy. 8. After the sitz bath, pat yourself dry. Do not rub your skin to dry it.  Over-the-toilet sitz bath To take a sitz bath with an over-the-toilet basin: 1. Follow the manufacturer's instructions. 2. Fill the basin with warm water. 3. If your health care provider told you to put medicine in the water, follow his or her instructions. 4. Sit on the seat. Make sure the water covers your buttocks and perineum. 5. Soak in the water for 15-20 minutes, or as long as told by your health care provider. 6. After the sitz bath, pat yourself  dry. Do not rub your skin to dry it. 7. Clean and dry the basin between uses. 8. Discard the basin if it cracks, or according to the manufacturer's instructions. Contact a health care provider if:  Your symptoms get worse. Do not continue with sitz baths if your symptoms get worse.  You have new symptoms. If this happens, do not continue with sitz baths until you talk with your health care provider. Summary  A sitz bath is a warm water bath in which the water only comes up to your hips and covers your buttocks.  A sitz bath may help relieve itching, relieve pain, and relax muscles that are sore or tight in the lower part of your body, including your genital area.  Take 3-4 sitz baths a day, or as many as told by your health care provider. Soak in the water for 15-20 minutes.  Do not continue with sitz baths if your symptoms get worse. This information is not intended to replace advice given to you by your health care provider. Make sure you discuss any questions you have with your health care provider. Document Revised: 12/31/2018 Document Reviewed: 08/03/2017 Elsevier Patient Education  2020 [employer].

## 2020-03-24 NOTE — Progress Notes (Signed)
Surgical Clinic Progress/Follow-up Note   HPI:  [name] presents to clinic with a 3-day history of increasing perianal pain.  Denies pain with bowel movements or change in bowel activity.  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  This area now is more anterior and on the right in the perianal region. Tolerating regular diet with +flatus and normal BM's, denies N/V, fever/chills, CP, or SOB. Can essentially localize it to the quadrant, but otherwise rather nonspecific.  Denies drainage.  Review of Systems:  Constitutional: denies fever/chills  ENT: denies sore throat, hearing problems  Respiratory: denies shortness of breath, wheezing  Cardiovascular: denies chest pain, palpitations  Gastrointestinal: denies abdominal pain, N/V, or diarrhea/and bowel function as per interval history Skin: Denies any other rashes or skin discolorations as per interval history  Vital Signs:  BP (!) 175/128   Pulse 80   Temp 98.3 F (36.8 C) (Oral)   Ht 5\' 10"  (1.778 m)   Wt 232 lb 12.8 oz (105.6 kg)   SpO2 98%   BMI 33.40 kg/m    Physical Exam:  Constitutional:  -- Normal/Obese body habitus  -- Awake, alert, and oriented x3  Pulmonary:  -- No crackles -- Equal breath sounds bilaterally -- Breathing non-labored at rest Cardiovascular:  -- S1, S2 present  -- No pericardial rubs  Gastrointestinal:  -- Soft and non-distended, non-tender/without tenderness to palpation, no guarding/rebound tenderness GU/perianal/coccygeal:  Post-surgical incisions all well-approximated without any peri-incisional erythema or drainage There is a small slightly thickened scarred/indurated area in the area of his greatest tenderness, without appreciable fluctuation, or pointing sufficient to warrant aspiration attempt or attempt at incision and drainage. Musculoskeletal / Integumentary:  -- Wounds or skin discoloration: None appreciated --  Extremities: B/L UE and LE FROM, hands and feet warm, no edema   Laboratory studies: None available.  Imaging: No new pertinent imaging available for review   Assessment:  44 y.o. yo Male with a problem list including...  Patient Active Problem List   Diagnosis Date Noted  . Snoring 09/29/2016  . COPD, mild (HCC) 07/20/2016  . Benign hypertension 07/14/2016  . Cough, persistent 07/14/2016  . Hyperglycemia 07/14/2016  . Other hyperlipidemia 07/14/2016  . 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  Plan:   We will provide Cipro Flagyl for 10 days, encourage utilization of sitz bath's, encouraged him to return as needed should things worsen.             - return to clinic 2 weeks or as needed, instructed to call office if any questions or concerns  All of the above recommendations were discussed with the patient and patient's family, and all of patient's and family's questions were answered to his/her/their expressed satisfaction.  02/07/2015, MD, FACS [place]: [facility] General [facility] - Partnering for exceptional care. Office: [phone]

## 2020-04-07 ENCOUNTER — Other Ambulatory Visit: Payer: Self-pay

## 2020-04-07 ENCOUNTER — Encounter: Payer: Self-pay | Admitting: Surgery

## 2020-04-07 ENCOUNTER — Ambulatory Visit (INDEPENDENT_AMBULATORY_CARE_PROVIDER_SITE_OTHER): Payer: BC Managed Care – PPO | Admitting: Surgery

## 2020-04-07 VITALS — BP 146/99 | HR 96 | Temp 97.9°F | Ht 71.0 in | Wt 233.8 lb

## 2020-04-07 DIAGNOSIS — K610 Anal abscess: Secondary | ICD-10-CM

## 2020-04-07 NOTE — Patient Instructions (Addendum)
Follow-up with our office as needed.  Please call and ask to speak with a nurse if you develop questions or concerns.   Wound Care, Adult Taking care of your wound properly can help to prevent pain, infection, and scarring. It can also help your wound to heal more quickly. How to care for your wound Wound care      Follow instructions from your health care provider about how to take care of your wound. Make sure you: ? Wash your hands with soap and water before you change the bandage (dressing). If soap and water are not available, use hand sanitizer. ? Change your dressing as told by your health care provider. ? Leave stitches (sutures), skin glue, or adhesive strips in place. These skin closures may need to stay in place for 2 weeks or longer. If adhesive strip edges start to loosen and curl up, you may trim the loose edges. Do not remove adhesive strips completely unless your health care provider tells you to do that.  Check your wound area every day for signs of infection. Check for: ? Redness, swelling, or pain. ? Fluid or blood. ? Warmth. ? Pus or a bad smell.  Ask your health care provider if you should clean the wound with mild soap and water. Doing this may include: ? Using a clean towel to pat the wound dry after cleaning it. Do not rub or scrub the wound. ? Applying a cream or ointment. Do this only as told by your health care provider. ? Covering the incision with a clean dressing.  Ask your health care provider when you can leave the wound uncovered.  Keep the dressing dry until your health care provider says it can be removed. Do not take baths, swim, use a hot tub, or do anything that would put the wound underwater until your health care provider approves. Ask your health care provider if you can take showers. You may only be allowed to take sponge baths. Medicines   If you were prescribed an antibiotic medicine, cream, or ointment, take or use the antibiotic as told  by your health care provider. Do not stop taking or using the antibiotic even if your condition improves.  Take over-the-counter and prescription medicines only as told by your health care provider. If you were prescribed pain medicine, take it 30 or more minutes before you do any wound care or as told by your health care provider. General instructions  Return to your normal activities as told by your health care provider. Ask your health care provider what activities are safe.  Do not scratch or pick at the wound.  Do not use any products that contain nicotine or tobacco, such as cigarettes and e-cigarettes. These may delay wound healing. If you need help quitting, ask your health care provider.  Keep all follow-up visits as told by your health care provider. This is important.  Eat a diet that includes protein, vitamin A, vitamin C, and other nutrient-rich foods to help the wound heal. ? Foods rich in protein include meat, dairy, beans, nuts, and other sources. ? Foods rich in vitamin A include carrots and dark green, leafy vegetables. ? Foods rich in vitamin C include citrus, tomatoes, and other fruits and vegetables. ? Nutrient-rich foods have protein, carbohydrates, fat, vitamins, or minerals. Eat a variety of healthy foods including vegetables, fruits, and whole grains. Contact a health care provider if:  You received a tetanus shot and you have swelling, severe pain, redness, or   bleeding at the injection site.  Your pain is not controlled with medicine.  You have redness, swelling, or pain around the wound.  You have fluid or blood coming from the wound.  Your wound feels warm to the touch.  You have pus or a bad smell coming from the wound.  You have a fever or chills.  You are nauseous or you vomit.  You are dizzy. Get help right away if:  You have a red streak going away from your wound.  The edges of the wound open up and separate.  Your wound is bleeding, and  the bleeding does not stop with gentle pressure.  You have a rash.  You faint.  You have trouble breathing. Summary  Always wash your hands with soap and water before changing your bandage (dressing).  To help with healing, eat foods that are rich in protein, vitamin A, vitamin C, and other nutrients.  Check your wound every day for signs of infection. Contact your health care provider if you suspect that your wound is infected. This information is not intended to replace advice given to you by your health care provider. Make sure you discuss any questions you have with your health care provider. Document Revised: 11/19/2018 Document Reviewed: 02/16/2016 Elsevier Patient Education  2020 [employer] Inc[employer]   

## 2020-04-07 NOTE — Progress Notes (Signed)
Surgical Clinic Progress/Follow-up Note   HPI:  Patient reports remarkable improvement with the course of oral antibiotics.  Denies any drainage, denies pain.  Wants to resume work. Previously: [name] presents to clinic with a 3-day history of increasing perianal pain.  Denies pain with bowel movements or change in bowel activity.  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  This area now is more anterior and on the right in the perianal region. Tolerating regular diet with +flatus and normal BM's, denies N/V, fever/chills, CP, or SOB. Can essentially localize it to the quadrant, but otherwise rather nonspecific.  Denies drainage.  Review of Systems:  Constitutional: denies fever/chills  ENT: denies sore throat, hearing problems  Respiratory: denies shortness of breath, wheezing  Cardiovascular: denies chest pain, palpitations  Gastrointestinal: denies abdominal pain, N/V, or diarrhea/and bowel function as per interval history Skin: Denies any other rashes or skin discolorations as per interval history  Vital Signs:  BP (!) 146/99   Pulse 96   Temp 97.9 F (36.6 C) (Oral)   Ht 5\' 11"  (1.803 m)   Wt 233 lb 12.8 oz (106.1 kg)   SpO2 96%   BMI 32.61 kg/m    Physical Exam:  Constitutional:  -- Normal/Obese body habitus  -- Awake, alert, and oriented x3  Pulmonary:  -- No crackles -- Equal breath sounds bilaterally -- Breathing non-labored at rest Cardiovascular:  -- S1, S2 present  -- No pericardial rubs  Gastrointestinal:  -- Soft and non-distended, non-tender/without tenderness to palpation, no guarding/rebound tenderness Musculoskeletal / Integumentary:  -- Wounds or skin discoloration: None appreciated -- Extremities: B/L UE and LE FROM, hands and feet warm, no edema   Laboratory studies: None available.  Imaging: No new pertinent imaging available for review   Assessment: Perianal tenderness  issue resolved. 44 y.o. yo Male with a problem list including...  Patient Active Problem List   Diagnosis Date Noted  . Snoring 09/29/2016  . COPD, mild (HCC) 07/20/2016  . Benign hypertension 07/14/2016  . Cough, persistent 07/14/2016  . Hyperglycemia 07/14/2016  . Other hyperlipidemia 07/14/2016  . Peri-rectal abscess 02/05/2015      Plan:               - return to clinic as needed, instructed to call office if any questions or concerns May return to work.  All of the above recommendations were discussed with the patient and patient's family, and all of patient's and family's questions were answered to his/her/their expressed satisfaction.  June, MD, FACS [place]: [facility] - [facility] for exceptional care. Office: [phone]

## 2020-11-18 ENCOUNTER — Other Ambulatory Visit: Payer: Self-pay

## 2020-11-18 ENCOUNTER — Emergency Department
Admission: EM | Admit: 2020-11-18 | Discharge: 2020-11-18 | Disposition: A | Discharge status: Home / Self Care | Payer: BC Managed Care – PPO | Attending: Emergency Medicine | Admitting: Emergency Medicine

## 2020-11-18 ENCOUNTER — Emergency Department: Payer: BC Managed Care – PPO

## 2020-11-18 DIAGNOSIS — I10 Essential (primary) hypertension: Secondary | ICD-10-CM | POA: Diagnosis not present

## 2020-11-18 DIAGNOSIS — F17210 Nicotine dependence, cigarettes, uncomplicated: Secondary | ICD-10-CM | POA: Diagnosis not present

## 2020-11-18 DIAGNOSIS — J449 Chronic obstructive pulmonary disease, unspecified: Secondary | ICD-10-CM | POA: Insufficient documentation

## 2020-11-18 DIAGNOSIS — R519 Headache, unspecified: Secondary | ICD-10-CM | POA: Diagnosis present

## 2020-11-18 LAB — CBC WITH DIFFERENTIAL/PLATELET
Abs Immature Granulocytes: 0.03 10*3/uL (ref 0.00–0.07)
Basophils Absolute: 0.1 10*3/uL (ref 0.0–0.1)
Basophils Relative: 1 %
Eosinophils Absolute: 0.2 10*3/uL (ref 0.0–0.5)
Eosinophils Relative: 2 %
HCT: 45 % (ref 39.0–52.0)
Hemoglobin: 15.7 g/dL (ref 13.0–17.0)
Immature Granulocytes: 0 %
Lymphocytes Relative: 24 %
Lymphs Abs: 2 10*3/uL (ref 0.7–4.0)
MCH: 31.5 pg (ref 26.0–34.0)
MCHC: 34.9 g/dL (ref 30.0–36.0)
MCV: 90.2 fL (ref 80.0–100.0)
Monocytes Absolute: 0.5 10*3/uL (ref 0.1–1.0)
Monocytes Relative: 6 %
Neutro Abs: 5.8 10*3/uL (ref 1.7–7.7)
Neutrophils Relative %: 67 %
Platelets: 166 10*3/uL (ref 150–400)
RBC: 4.99 MIL/uL (ref 4.22–5.81)
RDW: 13.1 % (ref 11.5–15.5)
WBC: 8.6 10*3/uL (ref 4.0–10.5)
nRBC: 0 % (ref 0.0–0.2)

## 2020-11-18 LAB — BASIC METABOLIC PANEL
Anion gap: 7 (ref 5–15)
BUN: 13 mg/dL (ref 6–20)
CO2: 24 mmol/L (ref 22–32)
Calcium: 9.3 mg/dL (ref 8.9–10.3)
Chloride: 105 mmol/L (ref 98–111)
Creatinine, Ser: 0.75 mg/dL (ref 0.61–1.24)
GFR, Estimated: >60 mL/min (ref >60)
Glucose, Bld: 112 mg/dL — ABNORMAL HIGH (ref 70–99)
Potassium: 4.1 mmol/L (ref 3.5–5.1)
Sodium: 136 mmol/L (ref 135–145)

## 2020-11-18 LAB — TROPONIN I (HIGH SENSITIVITY): Troponin I (High Sensitivity): 4 ng/L (ref <18)

## 2020-11-18 MED ORDER — PROCHLORPERAZINE EDISYLATE 10 MG/2ML IJ SOLN
10.0000 mg | Freq: Once | INTRAMUSCULAR | Status: AC
  Administered 2020-11-18: 10 mg via INTRAVENOUS
  Filled 2020-11-18: qty 2

## 2020-11-18 MED ORDER — DIPHENHYDRAMINE HCL 50 MG/ML IJ SOLN
25.0000 mg | Freq: Once | INTRAMUSCULAR | Status: AC
  Administered 2020-11-18: 25 mg via INTRAVENOUS
  Filled 2020-11-18: qty 1

## 2020-11-18 MED ORDER — LISINOPRIL 10 MG PO TABS: 10.0000 mg | ORAL_TABLET | Freq: Every day | ORAL | 0 refills | Status: AC

## 2020-11-18 MED ORDER — IOHEXOL 350 MG/ML SOLN
75.0000 mL | Freq: Once | INTRAVENOUS | Status: AC | PRN
  Administered 2020-11-18: 75 mL via INTRAVENOUS

## 2020-11-18 NOTE — ED Triage Notes (Signed)
Pt to ER form [facility] with wife with complaints of hypertension. Pt with no history of elevated blood pressures. C reports bp reading of 151/100. Pt also reports blurred vision and headache since Sunday. Reports weakness and states it is hard for him to hold his head up. No difficulty with speech or ambulation.

## 2020-11-18 NOTE — ED Notes (Signed)
Pt denies any desire for pain medication at this time. Aware to call this rn if he changes his mind.

## 2020-11-18 NOTE — ED Provider Notes (Signed)
[facility] Emergency Department Provider Note  ____________________________________________   Event Date/Time   First MD Initiated Contact with Patient 11/18/20 [phone]     (approximate)  I have reviewed the triage vital signs and the nursing notes.   HISTORY  Chief Complaint Hypertension    HPI Manuel Dixon is a 45 y.o. male here with headache.  The patient states that for the last several days, he has had gradual onset progressively worsening aching, throbbing, generalized headache.  He said associated fatigue.  He states that he checked his blood pressure at home and it was as high as 200s over 110s.  Subsequent presents for evaluation.  Denies any vision changes.  No chest pain or shortness of breath.  Has a history of hypertension was diagnosed years ago but did not take any medications.  Denies any significant recent increase in stressors although he is always stressed.  Denies any cocaine or stimulant use.  No other complaints.  No focal numbness or weakness.        Past Medical History:  Diagnosis Date  . COPD (chronic obstructive pulmonary disease) (HCC)   . Pneumonia 2015    Patient Active Problem List   Diagnosis Date Noted  . Snoring 09/29/2016  . COPD, mild (HCC) 07/20/2016  . Benign hypertension 07/14/2016  . Cough, persistent 07/14/2016  . Hyperglycemia 07/14/2016  . Other hyperlipidemia 07/14/2016  . Peri-rectal abscess 02/05/2015    Past Surgical History:  Procedure Laterality Date  . INCISION AND DRAINAGE PERIRECTAL ABSCESS N/A 02/11/2015   Procedure: IRRIGATION AND DEBRIDEMENT PERIRECTAL ABSCESS;  Surgeon: Earline Mayotte, MD;  Location: [facility] ORS;  Service: General;  Laterality: N/A;  . PILONIDAL CYST EXCISION  2014   Dr Katrinka Blazing    Prior to Admission medications   Medication Sig Start Date End Date Taking? Authorizing Provider  lisinopril (ZESTRIL) 10 MG tablet Take 1 tablet (10 mg total) by mouth daily. 11/18/20 01/17/21 Yes  Shaune Pollack, MD  albuterol (PROVENTIL HFA;VENTOLIN HFA) 108 (90 Base) MCG/ACT inhaler Inhale 2 puffs into the lungs every 4 (four) hours as needed for wheezing.  12/30/16   [provider]  ibuprofen (ADVIL) 200 MG tablet Take 400 mg by mouth every 6 (six) hours as needed for mild pain or moderate pain.    [provider]  ibuprofen (ADVIL) 800 MG tablet Take 1 tablet (800 mg total) by mouth every 8 (eight) hours as needed. Patient not taking: Reported on 03/24/2020 01/29/20   Campbell Lerner, MD  oxyCODONE-acetaminophen (PERCOCET) 7.5-325 MG tablet Take 1 tablet by mouth every 4 (four) hours as needed for severe pain. Patient not taking: Reported on 03/24/2020 01/30/20   Campbell Lerner, MD    Allergies Amoxil [amoxicillin], Codeine, and Hydrocodone  Family History  Problem Relation Age of Onset  . Heart attack Mother   . Colon cancer Neg Hx     Social History Social History   Tobacco Use  . Smoking status: Current Every Day Smoker    Packs/day: 1.00    Years: 20.00    Pack years: 20.00    Types: Cigarettes  . Smokeless tobacco: Never Used  Substance Use Topics  . Alcohol use: Yes    Alcohol/week: 6.0 - 12.0 standard drinks    Types: 6 - 12 Standard drinks or equivalent per week    Comment: PT NORMALLY DOES DRINK BUT HAS HAD NO ETOH IN 2 WEEKS PER PT (02-10-15)  . Drug use: No    Review  of Systems  Review of Systems  Constitutional: Positive for fatigue. Negative for chills and fever.  HENT: Negative for sore throat.   Respiratory: Negative for shortness of breath.   Cardiovascular: Negative for chest pain.  Gastrointestinal: Negative for abdominal pain.  Genitourinary: Negative for flank pain.  Musculoskeletal: Negative for neck pain.  Skin: Negative for rash and wound.  Allergic/Immunologic: Negative for immunocompromised state.  Neurological: Positive for headaches. Negative for weakness and numbness.  Hematological: Does not bruise/bleed easily.   All other systems reviewed and are negative.    ____________________________________________  PHYSICAL EXAM:      VITAL SIGNS: ED Triage Vitals  Enc Vitals Group     BP 11/18/20 0826 (!) 144/101     Pulse Rate 11/18/20 0827 82     Resp 11/18/20 0827 16     Temp 11/18/20 0827 98.2 F (36.8 C)     Temp Source 11/18/20 0827 Oral     SpO2 --      Weight 11/18/20 0826 230 lb (104.3 kg)     Height 11/18/20 0826 5\' 10"  (1.778 m)     Head Circumference --      Peak Flow --      Pain Score 11/18/20 0826 6     Pain Loc --      Pain Edu? --      Excl. in GC? --      Physical Exam Vitals and nursing note reviewed.  Constitutional:      General: He is not in acute distress.    Appearance: He is well-developed.  HENT:     Head: Normocephalic and atraumatic.  Eyes:     Conjunctiva/sclera: Conjunctivae normal.  Cardiovascular:     Rate and Rhythm: Normal rate and regular rhythm.     Heart sounds: Normal heart sounds.  Pulmonary:     Effort: Pulmonary effort is normal. No respiratory distress.     Breath sounds: No wheezing.  Abdominal:     General: There is no distension.  Musculoskeletal:     Cervical back: Neck supple.  Skin:    General: Skin is warm.     Capillary Refill: Capillary refill takes less than 2 seconds.     Findings: No rash.  Neurological:     Mental Status: He is alert and oriented to person, place, and time.     Motor: No abnormal muscle tone.     Comments: Neurological Exam:  Mental Status: Alert and oriented to person, place, and time. Attention and concentration normal. Speech clear. Recent memory is intact. Cranial Nerves: Visual fields grossly intact. EOMI and PERRLA. No nystagmus noted. Facial sensation intact at forehead, maxillary cheek, and chin/mandible bilaterally. No facial asymmetry or weakness. Hearing grossly normal. Uvula is midline, and palate elevates symmetrically. Normal SCM and trapezius strength. Tongue midline without  fasciculations. Motor: Muscle strength 5/5 in proximal and distal UE and LE bilaterally. No pronator drift. Muscle tone normal.  Sensation: Intact to light touch in upper and lower extremities distally bilaterally.  Gait: Normal without ataxia. Coordination: Normal FTN bilaterally.          ____________________________________________   LABS (all labs ordered are listed, but only abnormal results are displayed)  Labs Reviewed  BASIC METABOLIC PANEL - Abnormal; Notable for the following components:      Result Value   Glucose, Bld 112 (*)    All other components within normal limits  CBC WITH DIFFERENTIAL/PLATELET  TROPONIN I (HIGH SENSITIVITY)  TROPONIN I (HIGH  SENSITIVITY)    ____________________________________________  EKG: Normal sinus rhythm, jugular to 83.  PR 140, QRS 108, QTc 454.  No acute ST elevations or depressions.  No acute evidence of acute ischemia or infarct. ________________________________________  RADIOLOGY All imaging, including plain films, CT scans, and ultrasounds, independently reviewed by me, and interpretations confirmed via formal radiology reads.  ED MD interpretation:   CT angio head and neck: No acute abnormality, no evidence of aneurysm  Official radiology report(s): CT ANGIO HEAD NECK W WO CM (CODE STROKE)  Result Date: 11/18/2020 CLINICAL DATA:  Headache, intracranial hemorrhage suspected EXAM: CT ANGIOGRAPHY HEAD AND NECK TECHNIQUE: Multidetector CT imaging of the head and neck was performed using the standard protocol during bolus administration of intravenous contrast. Multiplanar CT image reconstructions and MIPs were obtained to evaluate the vascular anatomy. Carotid stenosis measurements (when applicable) are obtained utilizing NASCET criteria, using the distal internal carotid diameter as the denominator. CONTRAST:  42mL OMNIPAQUE IOHEXOL 350 MG/ML SOLN COMPARISON:  None. FINDINGS: CT HEAD Brain: There is no acute intracranial  hemorrhage, mass effect, or edema. Gray-white differentiation is preserved. There is no extra-axial fluid collection. Ventricles and sulci are within normal limits in size and configuration. Vascular: There is atherosclerotic calcification at the skull base. Skull: Calvarium is unremarkable. Sinuses/Orbits: No acute finding. Other: None. Review of the MIP images confirms the above findings CTA NECK Aortic arch: Great vessel origins are patent. Right carotid system: Trace calcified plaque at the ICA origin without stenosis. Mild tortuosity of the cervical ICA. Left carotid system: Patent. No stenosis at the ICA origin. Mild tortuosity of the cervical ICA. Vertebral arteries: Patent. Left vertebral artery is mildly dominant. No stenosis. Skeleton: Cervical spine degenerative changes primarily at C5-C6. Other neck: 2.4 cm fluid attenuation rounded lesion of the anterolateral right neck underlying the skin surface at the level of the hyoid probably reflects a small sebaceous cyst. Otherwise unremarkable. Upper chest: Emphysema. Review of the MIP images confirms the above findings CTA HEAD Anterior circulation: Intracranial internal carotid arteries are patent with mild calcified plaque. Anterior and middle cerebral arteries are patent Posterior circulation: Intracranial vertebral arteries, basilar artery, and posterior cerebral arteries are patent. Venous sinuses: Patent as allowed by contrast bolus timing. Review of the MIP images confirms the above findings IMPRESSION: No acute intracranial abnormality. No large vessel occlusion, hemodynamically significant stenosis, evidence of dissection, or aneurysm. Electronically Signed   By: Guadlupe Spanish M.D.   On: 11/18/2020 09:20    ____________________________________________  PROCEDURES   Procedure(s) performed (including Critical Care):  Procedures  ____________________________________________  INITIAL IMPRESSION / MDM / ASSESSMENT AND PLAN / ED COURSE  As  part of my medical decision making, I reviewed the following data within the electronic MEDICAL RECORD NUMBER Nursing notes reviewed and incorporated, Old chart reviewed, Notes from prior ED visits, and [place] Controlled Substance Database       *Manuel Dixon was evaluated in Emergency Department on 11/18/2020 for the symptoms described in the history of present illness. He was evaluated in the context of the global COVID-19 pandemic, which necessitated consideration that the patient might be at risk for infection with the SARS-CoV-2 virus that causes COVID-19. Institutional protocols and algorithms that pertain to the evaluation of patients at risk for COVID-19 are in a state of rapid change based on information released by regulatory bodies including the CDC and federal and state organizations. These policies and algorithms were followed during the patient's care in the ED.  Some ED evaluations  and interventions may be delayed as a result of limited staffing during the pandemic.*     Medical Decision Making: Well-appearing 45 year old male here with headache, hypertension.  Hypertension improved here with reassurance and pain control.  Suspect hypertension related headache.  CT angio of the head and neck obtained given family history and new onset of headache, shows no evidence of aneurysm, subarachnoid, or other abnormality.  Unlikely subarachnoid given lack of severe onset, negative CT for both bleed or stenosis or dissection or aneurysm.  Patient feels much better after migraine meds.  Lab work unremarkable.  Specifically, renal function normal.  EKG nonischemic and troponin is negative.  No signs of endorgan dysfunction from hypertension.  No evidence of stroke clinically or on CT.  Will start patient on antihypertensives given his history and symptomatic hypertension here, refer back to his PCP.  ____________________________________________  FINAL CLINICAL IMPRESSION(S) / ED DIAGNOSES  Final diagnoses:   Primary hypertension     MEDICATIONS GIVEN DURING THIS VISIT:  Medications  iohexol (OMNIPAQUE) 350 MG/ML injection 75 mL (75 mLs Intravenous Contrast Given 11/18/20 0857)  prochlorperazine (COMPAZINE) injection 10 mg (10 mg Intravenous Given 11/18/20 0939)  diphenhydrAMINE (BENADRYL) injection 25 mg (25 mg Intravenous Given 11/18/20 9147)     ED Discharge Orders         Ordered    lisinopril (ZESTRIL) 10 MG tablet  Daily        11/18/20 1101           Note:  This document was prepared using Dragon voice recognition software and may include unintentional dictation errors.   Shaune Pollack, MD 11/18/20 [phone]

## 2020-11-18 NOTE — ED Notes (Signed)
Pt comes from [facility] with c/o HTN, blurred vision, headache and ringing in ears since yesterday.

## 2020-11-18 NOTE — ED Notes (Signed)
Pt back in room, call light in reach, bed locked and low. Denies needs at this time. Wife at bedside.

## 2020-11-18 NOTE — ED Notes (Signed)
Patient transported to CT 

## 2020-11-18 NOTE — ED Notes (Signed)
Pt resting in bed, wife at bedside. Connected to monitor and bp cuff. Current bp 160/99. Pt c/o headache since Sunday, "like the top of my head is going to blow off." Bed locked and low, call light in reach.

## 2021-03-02 ENCOUNTER — Other Ambulatory Visit: Payer: Self-pay | Admitting: General Surgery

## 2021-03-02 NOTE — Progress Notes (Addendum)
Subjective:     Patient ID: Manuel Dixon is a 45 y.o. male.   HPI   The following portions of the patient's history were reviewed and updated as appropriate.   This an established patient is here today for: office visit. The patient has been referred today by Dr. Caryl Comes for evaluation of a pilonidal abscess. Patient was last seen 05-23-17 for a peri-rectal abscess. Patient reports Dr. Christian Mate did surgery for the same issue last June. Patient denies any drainage. He reports the flare up started around [facility]. The patient reports bright red blood per his rectum since [facility] as well. He states their is blood is on the tissue as well as in the toilet bowl.   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   In June 2021 he underwent exam under anesthesia and debridement of what was described as a recurrent pilonidal cyst.  He reports doing well for almost a year and then has had 2 episodes of swelling and pain, the first resolved with antibiotics prescribed for COVID at the end of May and the second with a recent course of antibiotics provided by his PCP.   Patient states he does have a colonoscopy already scheduled for September with the [facility] GI Department. He reports he did see Octavia Bruckner, PA here at the clinic.       Chief Complaint  Patient presents with   pilonidal abscess      BP (!) 144/84   Pulse 89   Temp 36.9 C (98.4 F)   Ht 177.8 cm (_0 )   Wt (!) 102.5 kg (226 lb)   SpO2 97%   BMI 32.43 kg/m        Past Medical History:  Diagnosis Date   Benign hypertension 07/14/2016   COPD, mild , unspecified (CMS-HCC) 07/20/2016    By pulmonary function tests 12/17.  FEV1/FVC 67% predicted to be: FEF 25-75% was 63% predicted.  Spiriva, albuterol recommended   Hyperglycemia 07/14/2016   Other hyperlipidemia 07/14/2016   Snoring 09/29/2016    Home sleep study 2/18 with RDI 5, AHI 0, with snoring  28.4% of the study.  ENT evaluation recommended           Past Surgical History:  Procedure Laterality Date   EUA, excision recurrent pilonidal disease   01/29/2020    Dr. Christian Mate   incision and drainage peri-rectal abscess   02/11/2015   PILONIDAL CYST / SINUS EXCISION   2014          Social History           Socioeconomic History   Marital status: Single  Tobacco Use   Smoking status: Current Every Day Smoker      Packs/day: 1.00      Years: 20.00      Pack years: 20.00   Smokeless tobacco: Never Used  Vaping Use   Vaping Use: Unknown  Substance and Sexual Activity   Alcohol use: Yes   Drug use: Never   Sexual activity: Defer             Allergies  Allergen Reactions   Amoxicillin Vomiting   Chantix [Varenicline] Other (See Comments)      nightmares   Codeine Nausea   Hydrocodone Nausea And Vomiting      Current Medications        Current Outpatient Medications  Medication Sig Dispense Refill   albuterol 90  mcg/actuation inhaler Inhale 2 inhalations into the lungs every 4 (four) hours as needed for Wheezing or Shortness of Breath 1 each 1   lisinopriL (ZESTRIL) 10 MG tablet Take 1 tablet (10 mg total) by mouth once daily 90 tablet 3    No current facility-administered medications for this visit.             Family History  Problem Relation Age of Onset   Myocardial Infarction (Heart attack) Mother     Diabetes type II Father     High blood pressure (Hypertension) Father     Colon cancer Neg Hx              Review of Systems  Constitutional: Negative for chills and fever.  Respiratory: Negative for cough.          Objective:   Physical Exam Exam conducted with a chaperone present.  Constitutional:      Appearance: Normal appearance.  Cardiovascular:     Rate and Rhythm: Normal rate and regular rhythm.     Pulses: Normal pulses.     Heart sounds: Normal heart sounds.  Lungs: Clear BS bilaterally.  Skin:         Comments: Midline  wound healed except for a less than 1 cm area along the midline.  Tenderness more so to the left than the right although no palpable thickening.  2 small central pits noted above this open area.  Photo in media.  Perianal area completely healed without evidence of inflammatory process.  Neurological:     Mental Status: He is alert and oriented to person, place, and time.  Psychiatric:        Mood and Affect: Mood normal.        Behavior: Behavior normal.      Labs and Radiology:    Operative report from January 29, 2020 reviewed.   January 20, 2021 laboratory:   Component Ref Range & Units 2 wk ago  (02/11/21) 1 mo ago  (01/20/21) 2 mo ago  (12/09/20) 1 yr ago  (10/29/19) 4 yr ago  (09/09/16) 4 yr ago  (09/09/16) 4 yr ago  (07/27/16)   Glucose 70 - 110 mg/dL 84  105  101  109  104        Sodium 136 - 145 mmol/L 140  138  138  137  139        Potassium 3.6 - 5.1 mmol/L 4.1  4.0  4.2  4.0  4.1        Chloride 97 - 109 mmol/L 106  104  106  103  107        Carbon Dioxide (CO2) 22.0 - 32.0 mmol/L 26.8  26.0  26.5  27.3  25.6        Urea Nitrogen (BUN) 7 - 25 mg/dL _0 Creatinine 0.7 - 1.3 mg/dL 0.7  0.7  0.7  0.8  0.7        Glomerular Filtration Rate (eGFR), MDRD Estimate >60 mL/min/1.73sq m 123  123  123  106  126        Calcium 8.7 - 10.3 mg/dL 9.8  9.4  9.6  8.9  9.8        AST  8 - 39 U/L 24  49 High   21  55 High     25  22    ALT  6 - 57  U/L 43  196 High   39  96 High     66 High   53    Alk Phos (alkaline Phosphatase) 34 - 104 U/L 65  57  60  60    51  60    Albumin 3.5 - 4.8 g/dL 4.5  4.3  4.2  4.0    4.3  4.4    Bilirubin, Total 0.3 - 1.2 mg/dL 0.8  1.0  0.7  0.9    0.8  0.8    Protein, Total 6.1 - 7.9 g/dL 7.2  7.4  6.7  6.8    7.0  6.9    A/G Ratio 1.0 - 5.0 gm/dL 1.7  1.4  1.7  1.4             WBC (White Blood Cell Count) 4.1 - 10.2 10^3/uL 8.8   RBC (Red Blood Cell Count) 4.69 - 6.13 10^6/uL 5.19   Hemoglobin 14.1 - 18.1 gm/dL 16.6   Hematocrit 40.0 - 52.0 %  47.1   MCV (Mean Corpuscular Volume) 80.0 - 100.0 fl 90.8   MCH (Mean Corpuscular Hemoglobin) 27.0 - 31.2 pg 32.0 High    MCHC (Mean Corpuscular Hemoglobin Concentration) 32.0 - 36.0 gm/dL 35.2   Platelet Count 150 - 450 10^3/uL 188   RDW-CV (Red Cell Distribution Width) 11.6 - 14.8 % 12.8   MPV (Mean Platelet Volume) 9.4 - 12.4 fl 11.4   Neutrophils 1.50 - 7.80 10^3/uL 5.69   Lymphocytes 1.00 - 3.60 10^3/uL 2.07   Monocytes 0.00 - 1.50 10^3/uL 0.72   Eosinophils 0.00 - 0.55 10^3/uL 0.16   Basophils 0.00 - 0.09 10^3/uL 0.06   Neutrophil % 32.0 - 70.0 % 64.9   Lymphocyte % 10.0 - 50.0 % 23.6   Monocyte % 4.0 - 13.0 % 8.2   Eosinophil % 1.0 - 5.0 % 1.8   Basophil% 0.0 - 2.0 % 0.7   Immature Granulocyte % <=0.7 % 0.8 High    Immature Granulocyte Count <=0.06 10^3/L 0.07 High           Assessment:     Recurrent pilonidal disease, previous surgery in 2014 (type unclear) and incision and drainage/debridement 2021.    Plan:     Area is fairly quiescent at this time, but patient describes 2 episodes of inflammation and drainage that improved with oral antibiotics.   Options for management include 1) chronic suppressive therapy and clipping the hair along the natal cleft (he had investigated laser hair removal but it was going to cost thousands of dollars) versus formal excision of the area and rotation flap coverage (Bascom) procedure.   At this time, the patient reports significant discomfort and desire to avoid recurrent inflammatory episodes.  He is aware that surgical therapy does not guarantee he will be free of recurrence in the distant future.   He had asked about ongoing disability.  With today's exam there is nothing to keep him out of work prior to surgery.  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 Surgery to be scheduled at a convenient date.     This note is partially prepared by Ledell Noss, CMA acting as  a scribe in the presence of Dr. Hervey Ard, MD.    The documentation recorded by the scribe accurately reflects the service I personally performed and the decisions made by me.    Robert Bellow, MD FACS

## 2021-03-05 ENCOUNTER — Other Ambulatory Visit
Admission: RE | Admit: 2021-03-05 | Discharge: 2021-03-05 | Disposition: A | Discharge status: Home / Self Care | Payer: BC Managed Care – PPO | Source: Ambulatory Visit | Attending: General Surgery | Admitting: General Surgery

## 2021-03-05 ENCOUNTER — Other Ambulatory Visit: Payer: Self-pay

## 2021-03-05 HISTORY — DX: Essential (primary) hypertension: I10

## 2021-03-05 NOTE — Patient Instructions (Signed)
Your procedure is scheduled on: Monday March 08, 2021. Report to [facility] inside [facility] 2nd floor stop by admissions desk first before getting on elevator. To find out your arrival time please call [phone] between 1PM - 3PM on Friday March 05, 2021.  Remember: Instructions that are not followed completely may result in serious medical risk,  up to and including death, or upon the discretion of your surgeon and anesthesiologist your  surgery may need to be rescheduled.     _X__ 1. Do not eat food after midnight the night before your procedure.                 No chewing gum or hard candies. You may drink clear liquids up to 2 hours                 before you are scheduled to arrive for your surgery- DO not drink clear                 liquids within 2 hours of the start of your surgery.                 Clear Liquids include:  water, apple juice without pulp, clear Gatorade, G2 or                  Gatorade Zero (avoid Red/Purple/Blue), Black Coffee or Tea (Do not add                 anything to coffee or tea).  __X__2.  On the morning of surgery brush your teeth with toothpaste and water, you                may rinse your mouth with mouthwash if you wish.  Do not swallow any toothpaste of mouthwash.     _X__ 3.  No Alcohol for 24 hours before or after surgery.   _X__ 4.  Do Not Smoke or use e-cigarettes For 24 Hours Prior to Your Surgery.                 Do not use any chewable tobacco products for at least 6 hours prior to                 Surgery.  _X__  5.  Do not use any recreational drugs (marijuana, cocaine, heroin, ecstasy, MDMA or other)                For at least one week prior to your surgery.  Combination of these drugs with anesthesia                May have life threatening results.   __X__ 6.  Notify your doctor if there is any change in your medical condition      (cold, fever, infections).     Do not wear jewelry, make-up, hairpins,  clips or nail polish. Do not wear lotions, powders, or perfumes. You may wear deodorant. Do not shave 48 hours prior to surgery.  Do not bring valuables to the hospital.    [facility] is not responsible for any belongings or valuables.  Contacts, dentures or bridgework may not be worn into surgery. Leave your suitcase in the car. After surgery it may be brought to your room. For patients admitted to the hospital, discharge time is determined by your treatment team.   Patients discharged the day of surgery will not be allowed to drive home.   Make  arrangements for someone to be with you for the first 24 hours of your Same Day Discharge.   __X__ Take these medicines the morning of surgery with A SIP OF WATER:    1. None   2.   3.   4.  5.  6.  ____ Fleet Enema (as directed)   __X__ Use Antibacterial Soap as directed  ____ Use Benzoyl Peroxide Gel as instructed  __X__ Use inhalers on the day of surgery  albuterol (PROVENTIL HFA;VENTOLIN HFA) 108 (90 Base) MCG/ACT inhaler  ____ Stop metformin 2 days prior to surgery    ____ Take 1/2 of usual insulin dose the night before surgery. No insulin the morning          of surgery.   ____ Call your PCP, cardiologist, or Pulmonologist if taking Coumadin/Plavix/aspirin and ask when to stop before your surgery.   __X__ One Week prior to surgery- Stop Anti-inflammatories such as Ibuprofen, Aleve, Advil, Motrin, meloxicam (MOBIC), diclofenac, etodolac, ketorolac, Toradol, Daypro, piroxicam, Goody's or BC powders. OK TO USE TYLENOL IF NEEDED   __X__ Stop supplements until after surgery.    ____ Bring C-Pap to the hospital.    If you have any questions regarding your pre-procedure instructions,  Please call Pre-admit Testing at [phone]

## 2021-03-07 MED ORDER — LACTATED RINGERS IV SOLN: INTRAVENOUS | Status: DC

## 2021-03-07 MED ORDER — ORAL CARE MOUTH RINSE: 15.0000 mL | Freq: Once | OROMUCOSAL | Status: AC

## 2021-03-07 MED ORDER — FAMOTIDINE 20 MG PO TABS: 20.0000 mg | ORAL_TABLET | Freq: Once | ORAL | Status: AC

## 2021-03-07 MED ORDER — CHLORHEXIDINE GLUCONATE CLOTH 2 % EX PADS: 6.0000 | MEDICATED_PAD | Freq: Once | CUTANEOUS | Status: DC

## 2021-03-07 MED ORDER — CHLORHEXIDINE GLUCONATE 0.12 % MT SOLN: 15.0000 mL | Freq: Once | OROMUCOSAL | Status: AC

## 2021-03-07 MED ORDER — CHLORHEXIDINE GLUCONATE CLOTH 2 % EX PADS
6.0000 | MEDICATED_PAD | Freq: Once | CUTANEOUS | Status: AC
  Administered 2021-03-08: 6 via TOPICAL

## 2021-03-08 ENCOUNTER — Encounter: Payer: Self-pay | Admitting: General Surgery

## 2021-03-08 ENCOUNTER — Ambulatory Visit
Admission: RE | Admit: 2021-03-08 | Discharge: 2021-03-08 | Disposition: A | Discharge status: Home / Self Care | Payer: BC Managed Care – PPO | Attending: General Surgery | Admitting: General Surgery

## 2021-03-08 ENCOUNTER — Ambulatory Visit: Payer: BC Managed Care – PPO

## 2021-03-08 ENCOUNTER — Other Ambulatory Visit: Payer: Self-pay

## 2021-03-08 ENCOUNTER — Encounter
Admission: RE | Disposition: A | Discharge status: Home / Self Care | Payer: Self-pay | Source: Home / Self Care | Attending: General Surgery

## 2021-03-08 DIAGNOSIS — Z885 Allergy status to narcotic agent status: Secondary | ICD-10-CM | POA: Diagnosis not present

## 2021-03-08 DIAGNOSIS — F17210 Nicotine dependence, cigarettes, uncomplicated: Secondary | ICD-10-CM | POA: Diagnosis not present

## 2021-03-08 DIAGNOSIS — L0592 Pilonidal sinus without abscess: Secondary | ICD-10-CM | POA: Insufficient documentation

## 2021-03-08 DIAGNOSIS — Z79899 Other long term (current) drug therapy: Secondary | ICD-10-CM | POA: Diagnosis not present

## 2021-03-08 DIAGNOSIS — L0591 Pilonidal cyst without abscess: Secondary | ICD-10-CM | POA: Diagnosis present

## 2021-03-08 DIAGNOSIS — Z880 Allergy status to penicillin: Secondary | ICD-10-CM | POA: Insufficient documentation

## 2021-03-08 HISTORY — PX: PILONIDAL CYST EXCISION: SHX744

## 2021-03-08 SURGERY — EXCISION, PILONIDAL CYST, EXTENSIVE
Anesthesia: General

## 2021-03-08 MED ORDER — DOXYCYCLINE HYCLATE 100 MG PO CAPS: 100.0000 mg | ORAL_CAPSULE | Freq: Two times a day (BID) | ORAL | 1 refills | Status: AC

## 2021-03-08 MED ORDER — MIDAZOLAM HCL 2 MG/2ML IJ SOLN
INTRAMUSCULAR | Status: DC | PRN
  Administered 2021-03-08: 2 mg via INTRAVENOUS

## 2021-03-08 MED ORDER — TRAMADOL HCL 50 MG PO TABS
50.0000 mg | ORAL_TABLET | Freq: Once | ORAL | Status: AC
  Administered 2021-03-08: 50 mg via ORAL

## 2021-03-08 MED ORDER — SODIUM CHLORIDE 0.9 % IV SOLN
1.0000 g | INTRAVENOUS | Status: AC
  Administered 2021-03-08: 1 g via INTRAVENOUS
  Filled 2021-03-08: qty 1

## 2021-03-08 MED ORDER — MIDAZOLAM HCL 2 MG/2ML IJ SOLN
INTRAMUSCULAR | Status: AC
  Filled 2021-03-08: qty 2

## 2021-03-08 MED ORDER — FENTANYL CITRATE (PF) 100 MCG/2ML IJ SOLN
INTRAMUSCULAR | Status: AC
  Filled 2021-03-08: qty 2

## 2021-03-08 MED ORDER — TRAMADOL HCL 50 MG PO TABS: 50.0000 mg | ORAL_TABLET | ORAL | 0 refills | Status: AC | PRN

## 2021-03-08 MED ORDER — ROCURONIUM BROMIDE 10 MG/ML (PF) SYRINGE
PREFILLED_SYRINGE | INTRAVENOUS | Status: AC
  Filled 2021-03-08: qty 10

## 2021-03-08 MED ORDER — LIDOCAINE HCL (CARDIAC) PF 100 MG/5ML IV SOSY
PREFILLED_SYRINGE | INTRAVENOUS | Status: DC | PRN
  Administered 2021-03-08: 100 mg via INTRAVENOUS

## 2021-03-08 MED ORDER — CHLORHEXIDINE GLUCONATE 0.12 % MT SOLN
OROMUCOSAL | Status: AC
  Administered 2021-03-08: 15 mL via OROMUCOSAL
  Filled 2021-03-08: qty 15

## 2021-03-08 MED ORDER — DEXAMETHASONE SODIUM PHOSPHATE 10 MG/ML IJ SOLN
INTRAMUSCULAR | Status: DC | PRN
  Administered 2021-03-08: 5 mg via INTRAVENOUS

## 2021-03-08 MED ORDER — FAMOTIDINE 20 MG PO TABS
ORAL_TABLET | ORAL | Status: AC
  Administered 2021-03-08: 20 mg via ORAL
  Filled 2021-03-08: qty 1

## 2021-03-08 MED ORDER — PROPOFOL 10 MG/ML IV BOLUS
INTRAVENOUS | Status: DC | PRN
  Administered 2021-03-08: 200 mg via INTRAVENOUS

## 2021-03-08 MED ORDER — FENTANYL CITRATE (PF) 100 MCG/2ML IJ SOLN
INTRAMUSCULAR | Status: AC
  Administered 2021-03-08: 25 ug via INTRAVENOUS
  Filled 2021-03-08: qty 2

## 2021-03-08 MED ORDER — PROPOFOL 10 MG/ML IV BOLUS
INTRAVENOUS | Status: AC
  Filled 2021-03-08: qty 20

## 2021-03-08 MED ORDER — ROCURONIUM BROMIDE 100 MG/10ML IV SOLN
INTRAVENOUS | Status: DC | PRN
  Administered 2021-03-08: 50 mg via INTRAVENOUS

## 2021-03-08 MED ORDER — FENTANYL CITRATE (PF) 100 MCG/2ML IJ SOLN
INTRAMUSCULAR | Status: DC | PRN
  Administered 2021-03-08 (×3): 50 ug via INTRAVENOUS

## 2021-03-08 MED ORDER — SUGAMMADEX SODIUM 200 MG/2ML IV SOLN
INTRAVENOUS | Status: DC | PRN
  Administered 2021-03-08: 200 mg via INTRAVENOUS

## 2021-03-08 MED ORDER — KETOROLAC TROMETHAMINE 30 MG/ML IJ SOLN
INTRAMUSCULAR | Status: DC | PRN
  Administered 2021-03-08: 30 mg via INTRAVENOUS

## 2021-03-08 MED ORDER — BUPIVACAINE-EPINEPHRINE (PF) 0.5% -1:200000 IJ SOLN
INTRAMUSCULAR | Status: AC
  Filled 2021-03-08: qty 30

## 2021-03-08 MED ORDER — PROMETHAZINE HCL 25 MG/ML IJ SOLN: 6.2500 mg | INTRAMUSCULAR | Status: DC | PRN

## 2021-03-08 MED ORDER — 0.9 % SODIUM CHLORIDE (POUR BTL) OPTIME
TOPICAL | Status: DC | PRN
  Administered 2021-03-08: 500 mL

## 2021-03-08 MED ORDER — TRAMADOL HCL 50 MG PO TABS
ORAL_TABLET | ORAL | Status: AC
  Filled 2021-03-08: qty 1

## 2021-03-08 MED ORDER — ACETAMINOPHEN 10 MG/ML IV SOLN
INTRAVENOUS | Status: AC
  Filled 2021-03-08: qty 100

## 2021-03-08 MED ORDER — ACETAMINOPHEN 10 MG/ML IV SOLN
INTRAVENOUS | Status: DC | PRN
Start: 2021-03-08 — End: 2021-03-08
  Administered 2021-03-08: 1000 mg via INTRAVENOUS

## 2021-03-08 MED ORDER — DEXMEDETOMIDINE (PRECEDEX) IN NS 20 MCG/5ML (4 MCG/ML) IV SYRINGE
PREFILLED_SYRINGE | INTRAVENOUS | Status: DC | PRN
  Administered 2021-03-08: 20 ug via INTRAVENOUS

## 2021-03-08 MED ORDER — BUPIVACAINE-EPINEPHRINE 0.5% -1:200000 IJ SOLN
INTRAMUSCULAR | Status: DC | PRN
  Administered 2021-03-08: 30 mL

## 2021-03-08 MED ORDER — FENTANYL CITRATE (PF) 100 MCG/2ML IJ SOLN
25.0000 ug | INTRAMUSCULAR | Status: AC | PRN
  Administered 2021-03-08 (×4): 25 ug via INTRAVENOUS

## 2021-03-08 SURGICAL SUPPLY — 40 items
BLADE SURG 11 STRL SS SAFETY (MISCELLANEOUS) ×2 IMPLANT
BLADE SURG 15 STRL SS SAFETY (BLADE) ×2 IMPLANT
BULB RESERV EVAC DRAIN JP 100C (MISCELLANEOUS) ×2 IMPLANT
CANISTER SUCT 1200ML W/VALVE (MISCELLANEOUS) ×2 IMPLANT
DRAIN CHANNEL JP 15F RND 16 (MISCELLANEOUS) ×2 IMPLANT
DRAPE LAPAROTOMY 100X77 ABD (DRAPES) ×2 IMPLANT
DRSG TEGADERM 4X4.75 (GAUZE/BANDAGES/DRESSINGS) ×2 IMPLANT
DRSG TELFA 3X8 NADH (GAUZE/BANDAGES/DRESSINGS) ×2 IMPLANT
ELECT CAUTERY BLADE TIP 2.5 (TIP) ×2
ELECT REM PT RETURN 9FT ADLT (ELECTROSURGICAL) ×2
ELECTRODE CAUTERY BLDE TIP 2.5 (TIP) ×1 IMPLANT
ELECTRODE REM PT RTRN 9FT ADLT (ELECTROSURGICAL) ×1 IMPLANT
GAUZE 4X4 16PLY ~~LOC~~+RFID DBL (SPONGE) ×2 IMPLANT
GLOVE SURG ENC MOIS LTX SZ7.5 (GLOVE) ×2 IMPLANT
GLOVE SURG UNDER LTX SZ8 (GLOVE) ×2 IMPLANT
GOWN STRL REUS W/ TWL LRG LVL3 (GOWN DISPOSABLE) ×2 IMPLANT
GOWN STRL REUS W/TWL LRG LVL3 (GOWN DISPOSABLE) ×4
KIT TURNOVER KIT A (KITS) ×2 IMPLANT
LABEL OR SOLS (LABEL) ×2 IMPLANT
MANIFOLD NEPTUNE II (INSTRUMENTS) ×2 IMPLANT
NEEDLE HYPO 22GX1.5 SAFETY (NEEDLE) ×2 IMPLANT
NEEDLE HYPO 25X1 1.5 SAFETY (NEEDLE) ×2 IMPLANT
NS IRRIG 500ML POUR BTL (IV SOLUTION) ×2 IMPLANT
PACK BASIN MINOR ARMC (MISCELLANEOUS) ×2 IMPLANT
SOL PREP PVP 2OZ (MISCELLANEOUS) ×2
SOLUTION PREP PVP 2OZ (MISCELLANEOUS) ×1 IMPLANT
STRIP CLOSURE SKIN 1/2X4 (GAUZE/BANDAGES/DRESSINGS) ×2 IMPLANT
SUT ETHILON 3-0 FS-10 30 BLK (SUTURE) ×2
SUT PROLENE 4-0 (SUTURE) ×2
SUT PROLENE 4-0 RB1 30XMFL BLU (SUTURE) ×1
SUT VIC AB 2-0 CT1 (SUTURE) ×2 IMPLANT
SUT VIC AB 2-0 CT1 27 (SUTURE) ×2
SUT VIC AB 2-0 CT1 TAPERPNT 27 (SUTURE) ×1 IMPLANT
SUT VIC AB 2-0 CT2 27 (SUTURE) ×2 IMPLANT
SUT VICRYL 3-0 27IN (SUTURE) ×2 IMPLANT
SUT VICRYL+ 3-0 144IN (SUTURE) ×2 IMPLANT
SUTURE EHLN 3-0 FS-10 30 BLK (SUTURE) ×1 IMPLANT
SUTURE PROLEN 4-0 RB1 30XMFL (SUTURE) ×1 IMPLANT
SWABSTK COMLB BENZOIN TINCTURE (MISCELLANEOUS) ×2 IMPLANT
SYR 10ML LL (SYRINGE) ×2 IMPLANT

## 2021-03-08 NOTE — Anesthesia Procedure Notes (Signed)
Procedure Name: Intubation Date/Time: 03/08/2021 1:35 PM Performed by: Fredderick Phenix, CRNA Pre-anesthesia Checklist: Patient identified, Emergency Drugs available, Suction available and Patient being monitored Patient Re-evaluated:Patient Re-evaluated prior to induction Oxygen Delivery Method: Circle system utilized Preoxygenation: Pre-oxygenation with 100% oxygen Induction Type: IV induction Ventilation: Mask ventilation without difficulty Laryngoscope Size: Mac and 4 Grade View: Grade I Tube type: Oral Tube size: 7.5 mm Number of attempts: 1 Airway Equipment and Method: Stylet and Oral airway Placement Confirmation: ETT inserted through vocal cords under direct vision, positive ETCO2 and breath sounds checked- equal and bilateral Secured at: 23 cm Tube secured with: Tape Dental Injury: Teeth and Oropharynx as per pre-operative assessment

## 2021-03-08 NOTE — Discharge Instructions (Signed)
AMBULATORY SURGERY  ?DISCHARGE INSTRUCTIONS ? ? ?The drugs that you were given will stay in your system until tomorrow so for the next 24 hours you should not: ? ?Drive an automobile ?Make any legal decisions ?Drink any alcoholic beverage ? ? ?You may resume regular meals tomorrow.  Today it is better to start with liquids and gradually work up to solid foods. ? ?You may eat anything you prefer, but it is better to start with liquids, then soup and crackers, and gradually work up to solid foods. ? ? ?Please notify your doctor immediately if you have any unusual bleeding, trouble breathing, redness and pain at the surgery site, drainage, fever, or pain not relieved by medication. ? ? ? ?Additional Instructions: ? ? ? ?Please contact your physician with any problems or Same Day Surgery at [phone], Monday through Friday 6 am to 4 pm, or [place] [facility] [place] [facility] number at [phone].  ?

## 2021-03-08 NOTE — Op Note (Signed)
Preoperative diagnosis: Recurrent pilonidal cyst.  Postoperative diagnosis: Same.  Operative procedure: Pilonidal cyst excision with rotation flap coverage, Bascom procedure.  Operating Surgeon: Donnalee Curry, MD.  Anesthesia: General endotracheal, Marcaine 0.5% with 1: 200,000 units of epinephrine, 30 cc.  Estimated blood loss: 10 cc.  Clinical note: This 45 year old male had a pilonidal cyst back in 2014.  He required surgical intervention in 2021.  In the last few months he has had 2 episodes of pain and swelling treated with antibiotics by his PCP.  At the time of recent evaluation there was only thickening related to his prior scar but tenderness to the left of the midline.  With the likelihood of recurrent pilonidal cyst disease was elected to proceed with excision and rotational flap coverage.  The patient reported the morning of surgery that he had developed a tender area in the gluteal crease and the top of the left leg.  Examination showed some slight tenderness and thickening but no distinct mass-effect.  The patient received Invanz prior to the procedure.  Operative note: The patient underwent general endotracheal anesthesia and tolerated this well.  He was rolled to the prone position and appropriately padded.  The buttocks were taped apart.  Hair was removed from the surgical site.  The area was cleansed with Betadine solution and draped.  An elliptical incision centered just to the right of the midline and extending to include all the area of previous scarring with a curvilinear change at the left base was made.  The skin was incised sharply and the remaining dissection completed with electrocautery.  The flap was elevated on the right side for approximately 6 cm and was about 8 mm in thickness.  The central tissue was excised down to the sacral fascia.  Hemostasis was electrocautery.  The tapes were released.  There was a slight tension at the superior aspect and the adipose  tissue was freed from the gluteal fascia.  A 15 [place] Blake drain was placed and brought through a separate stab incision on the right buttock.  This was anchored in the position with a 3-0 nylon suture.  The deep tissue was approximated with interrupted 2-0 Vicryl figure-of-eight sutures.  A second layer in the more superficial fat was used.  The flap was then brought across and anchored in position with similar 2-0 Vicryl figure-of-eight sutures.  The subcutaneous fat was approximated with a running 3-0 Vicryl suture.  At the most inferior aspect where the incision swept inferior and laterally individual sutures of 4-0 Vicryl were placed in the skin and then a running 4-0 Vicryl subcuticular suture was placed for the main wound  Benzoin and Steri-Strips followed by Telfa and Tegaderm dressing applied.  The patient was rolled supine and extubated without incident.  He tolerated the procedure well and was taken the PACU in stable condition.

## 2021-03-08 NOTE — Anesthesia Preprocedure Evaluation (Signed)
Anesthesia Evaluation  Patient identified by MRN, date of birth, ID band Patient awake    Reviewed: Allergy & Precautions, NPO status , Patient's Chart, lab work & pertinent test results  History of Anesthesia Complications Negative for: history of anesthetic complications  Airway Mallampati: II  TM Distance: >3 FB Neck ROM: Full    Dental  (+) Dental Advidsory Given   Pulmonary neg shortness of breath, pneumonia, resolved, COPD,  COPD inhaler, neg recent URI, Current Smoker and Patient abstained from smoking.,    Pulmonary exam normal breath sounds clear to auscultation       Cardiovascular hypertension, (-) angina(-) Past MI negative cardio ROS Normal cardiovascular exam(-) dysrhythmias (-) Valvular Problems/Murmurs     Neuro/Psych negative neurological ROS  negative psych ROS   GI/Hepatic Neg liver ROS, Perirectal abcess   Endo/Other  negative endocrine ROS  Renal/GU negative Renal ROS  negative genitourinary   Musculoskeletal negative musculoskeletal ROS (+)   Abdominal Normal abdominal exam  (+)   Peds negative pediatric ROS (+)  Hematology negative hematology ROS (+)   Anesthesia Other Findings Past Medical History: No date: COPD (chronic obstructive pulmonary disease) (HCC) No date: Hypertension 2015: Pneumonia   Reproductive/Obstetrics                             Anesthesia Physical  Anesthesia Plan  ASA: 2  Anesthesia Plan: General   Post-op Pain Management:    Induction: Intravenous  PONV Risk Score and Plan: 1 and Ondansetron, Dexamethasone, Midazolam and Treatment may vary due to age or medical condition  Airway Management Planned: Oral ETT and Video Laryngoscope Planned  Additional Equipment:   Intra-op Plan:   Post-operative Plan: Extubation in OR  Informed Consent: I have reviewed the patients History and Physical, chart, labs and discussed the procedure  including the risks, benefits and alternatives for the proposed anesthesia with the patient or authorized representative who has indicated his/her understanding and acceptance.     Dental advisory given  Plan Discussed with: CRNA and Surgeon  Anesthesia Plan Comments:         Anesthesia Quick Evaluation

## 2021-03-08 NOTE — Transfer of Care (Signed)
Immediate Anesthesia Transfer of Care Note  Patient: Manuel Dixon  Procedure(s) Performed: CYST EXCISION PILONIDAL EXTENSIVE  Patient Location: PACU  Anesthesia Type:General  Level of Consciousness: awake and alert   Airway & Oxygen Therapy: Patient Spontanous Breathing and Patient connected to face mask oxygen  Post-op Assessment: Report given to RN and Post -op Vital signs reviewed and stable  Post vital signs: Reviewed and stable  Last Vitals:  Vitals Value Taken Time  BP 130/80 03/08/21 1449  Temp    Pulse 87 03/08/21 1454  Resp 19 03/08/21 1454  SpO2 100 % 03/08/21 1454  Vitals shown include unvalidated device data.  Last Pain:  Vitals:   03/08/21 0955  TempSrc: Oral  PainSc: 0-No pain         Complications: No notable events documented.

## 2021-03-08 NOTE — H&P (Signed)
Manuel Dixon [id number] 12/01/1975     HPI:  Recurrent pilonidal cyst.  New area of inflammation in the left gluteal crease developed over the weekend.   Medications Prior to Admission  Medication Sig Dispense Refill Last Dose   albuterol (PROVENTIL HFA;VENTOLIN HFA) 108 (90 Base) MCG/ACT inhaler Inhale 2 puffs into the lungs every 4 (four) hours as needed for wheezing.    03/08/2021 at 0730   ibuprofen (ADVIL) 200 MG tablet Take 400 mg by mouth every 6 (six) hours as needed for mild pain or moderate pain.   Past Week   lisinopril (ZESTRIL) 10 MG tablet Take 1 tablet (10 mg total) by mouth daily. 60 tablet 0    Allergies  Allergen Reactions   Amoxil [Amoxicillin] Nausea And Vomiting   Codeine Nausea Only   Hydrocodone Nausea And Vomiting   Past Medical History:  Diagnosis Date   COPD (chronic obstructive pulmonary disease) (HCC)    Hypertension    Pneumonia 2015   Past Surgical History:  Procedure Laterality Date   INCISION AND DRAINAGE PERIRECTAL ABSCESS N/A 02/11/2015   Procedure: IRRIGATION AND DEBRIDEMENT PERIRECTAL ABSCESS;  Surgeon: Earline Mayotte, MD;  Location: [facility] ORS;  Service: General;  Laterality: N/A;   PILONIDAL CYST EXCISION  2014   Dr Katrinka Blazing   Social History   Socioeconomic History   Marital status: Single    Spouse name: Not on file   Number of children: Not on file   Years of education: Not on file   Highest education level: Not on file  Occupational History   Not on file  Tobacco Use   Smoking status: Every Day    Packs/day: 1.00    Years: 20.00    Pack years: 20.00    Types: Cigarettes   Smokeless tobacco: Never  Substance and Sexual Activity   Alcohol use: Yes    Alcohol/week: 6.0 - 12.0 standard drinks    Types: 6 - 12 Standard drinks or equivalent per week    Comment: PT NORMALLY DOES DRINK BUT HAS HAD NO ETOH IN 2 WEEKS PER PT (02-10-15)   Drug use: No   Sexual activity: Yes  Other Topics Concern   Not on file  Social History  Narrative   Not on file   Social Determinants of Health   Financial Resource Strain: Not on file  Food Insecurity: Not on file  Transportation Needs: Not on file  Physical Activity: Not on file  Stress: Not on file  Social Connections: Not on file  Intimate Partner Violence: Not on file   Social History   Social History Narrative   Not on file     ROS: Negative.     PE: HEENT: Negative. Lungs: Clear. Cardio: RR.  Buttock: Draining sinus anterior in the gluteal fold.     Assessment/Plan:  Proceed with planned rotation flap coverage of pilonidal cyst.  EUA of new area of inflammation in the left gluteal fold.  Merrily Pew [facility] 03/08/2021

## 2021-03-08 NOTE — Anesthesia Postprocedure Evaluation (Signed)
Anesthesia Post Note  Patient: Manuel Dixon  Procedure(s) Performed: CYST EXCISION PILONIDAL EXTENSIVE  Patient location during evaluation: PACU Anesthesia Type: General Level of consciousness: awake and alert Pain management: pain level controlled Vital Signs Assessment: post-procedure vital signs reviewed and stable Respiratory status: spontaneous breathing, nonlabored ventilation, respiratory function stable and patient connected to nasal cannula oxygen Cardiovascular status: blood pressure returned to baseline and stable Postop Assessment: no apparent nausea or vomiting Anesthetic complications: no   No notable events documented.   Last Vitals:  Vitals:   03/08/21 1515 03/08/21 1530  BP: 127/83 134/88  Pulse: 64 64  Resp:  12  Temp:    SpO2: 99% 98%    Last Pain:  Vitals:   03/08/21 1530  TempSrc:   PainSc: 4                  Lenard Simmer

## 2021-03-09 ENCOUNTER — Encounter: Payer: Self-pay | Admitting: General Surgery

## 2021-03-10 LAB — SURGICAL PATHOLOGY

## 2021-04-20 IMAGING — MR MR PELVIS WO/W CM
10 series · 46 of 48 positions shown · IV contrast (gadavist)
Comparison: None.

CLINICAL DATA: History of perirectal abscess, recurrent abscess and
drainage, history of pilonidal surgery

EXAM:
MRI PELVIS WITHOUT AND WITH CONTRAST
TECHNIQUE: Multiplanar multisequence MR imaging of the pelvis was performed
both before and after administration of intravenous contrast.
CONTRAST:  10mL GADAVIST GADOBUTROL 1 MMOL/ML IV SOLN

[Series 3: T2 · sagittal · 2.5mm · 0.81mm/px · 6 of 35 slices shown (1 of 2)]
[im 1/35]
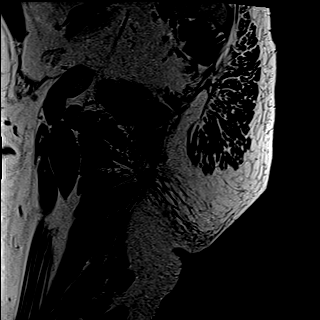
[im 7/35]
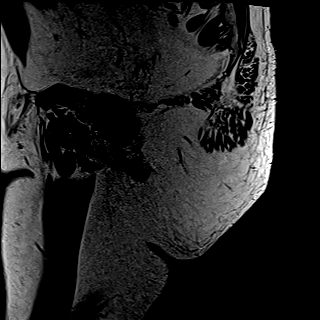
[im 14/35]
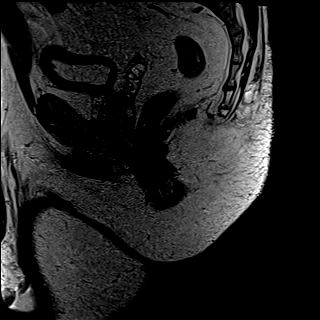
[im 21/35]
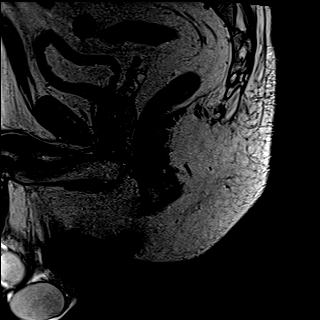
[im 28/35]
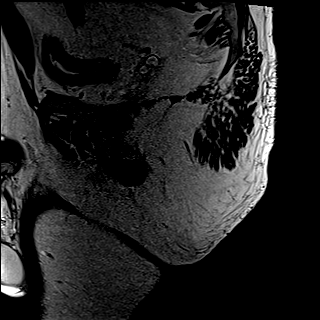
[im 35/35]
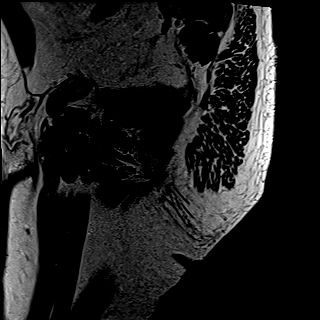

[Series 5: T2 · sagittal · 2.5mm · 0.81mm/px · 6 of 35 slices shown (2 of 2)]
[im 1/35]
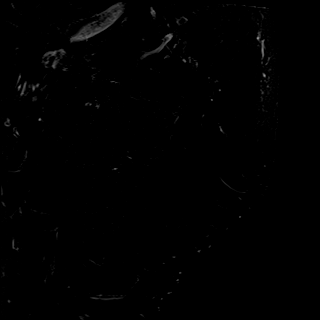
[im 7/35]
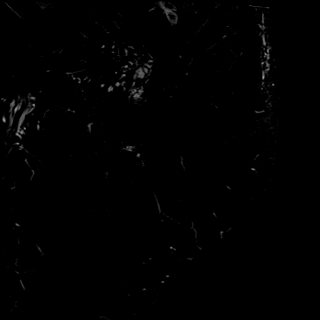
[im 14/35]
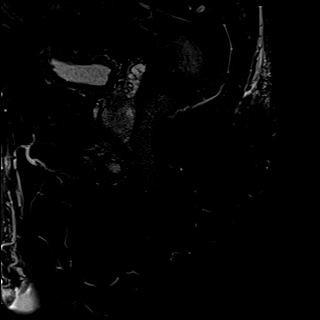
[im 21/35]
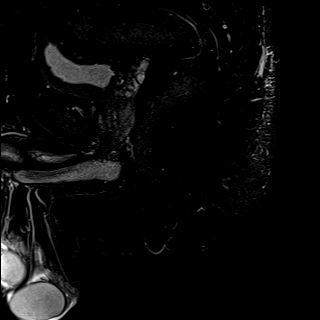
[im 28/35]
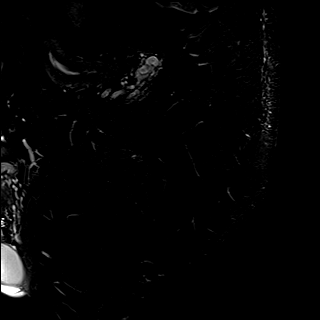
[im 35/35]
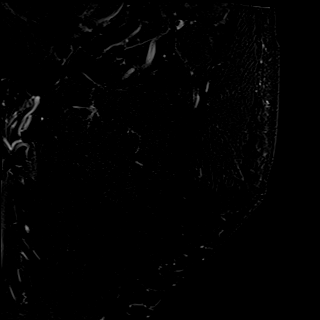

[Series 9: DIXON · axial · 4.0mm · 0.69mm/px · z∈[-226,-100]mm · 5 of 30 slices shown (1 of 6)]
[im 1/30]
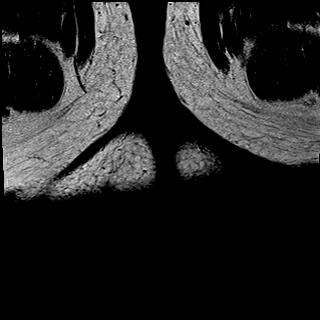
[im 8/30]
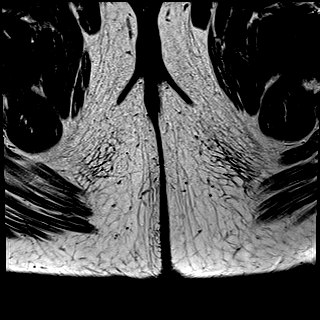
[im 15/30]
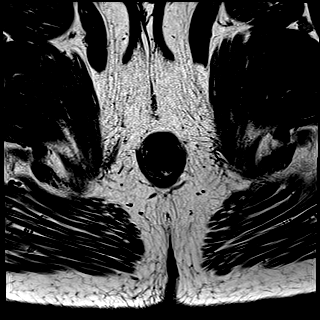
[im 22/30]
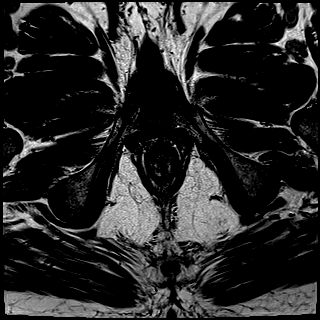
[im 30/30]
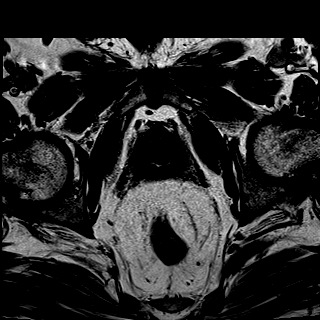

[Series 11: DIXON · axial · 4.0mm · 0.69mm/px · z∈[-226,-100]mm · 4 of 30 slices shown (2 of 6)]
[im 1/30]
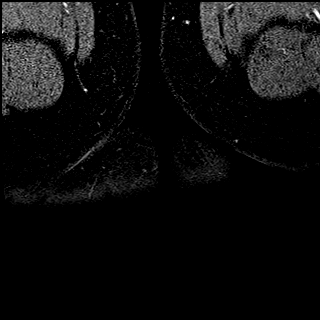
[im 10/30]
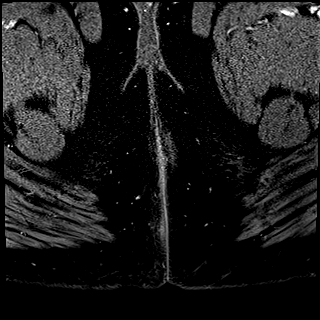
[im 20/30]
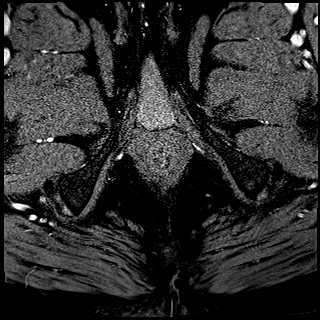
[im 30/30]
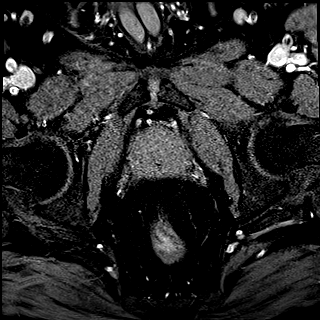

[Series 12: DIXON · axial · 4.0mm · 0.69mm/px · z∈[-226,-100]mm · 4 of 30 slices shown (3 of 6)]
[im 1/30]
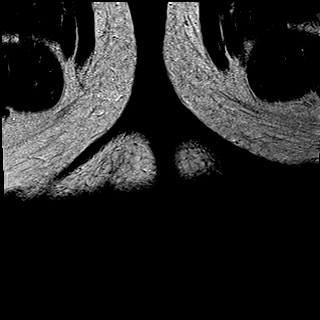
[im 10/30]
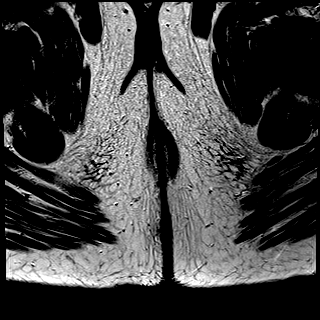
[im 20/30]
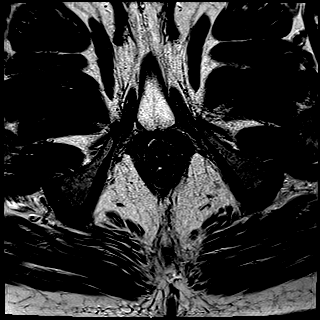
[im 30/30]
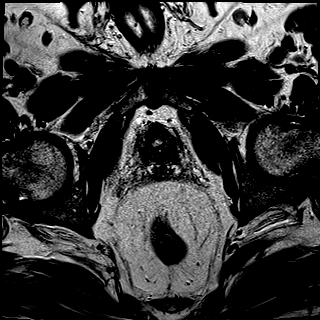

[Series 14: DIXON · axial · 4.0mm · 0.69mm/px · z∈[-226,-100]mm · 4 of 30 slices shown (4 of 6)]
[im 1/30]
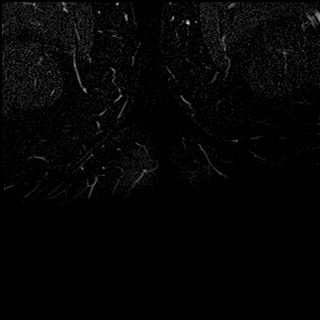
[im 10/30]
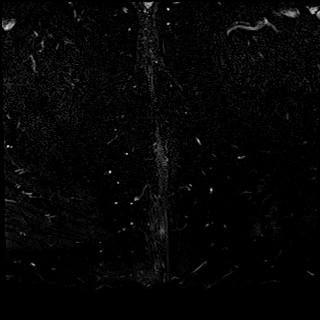
[im 20/30]
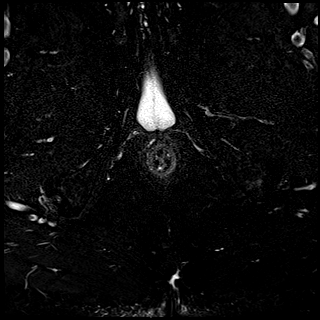
[im 30/30]
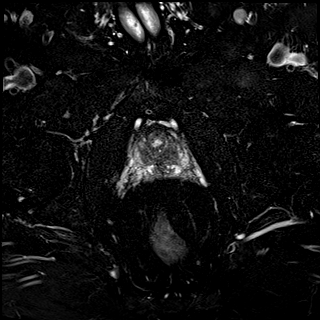

[Series 15: DIXON · coronal · 4.0mm · 0.75mm/px · 5 of 35 slices shown (5 of 6)]
[im 1/35]
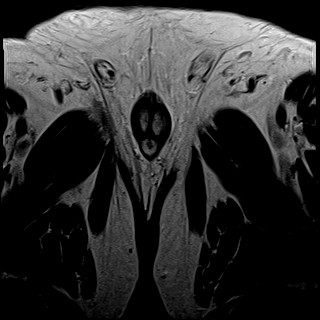
[im 9/35]
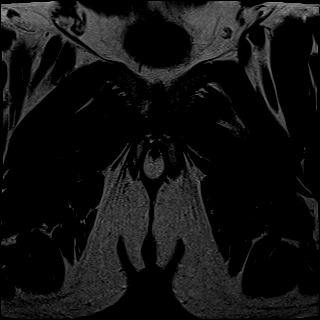
[im 18/35]
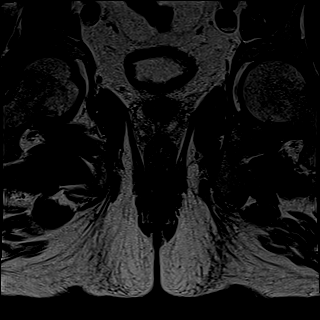
[im 26/35]
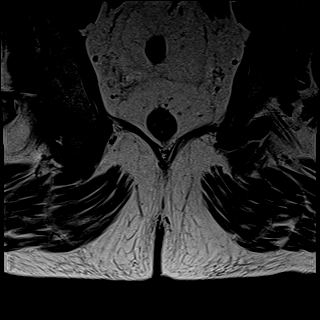
[im 35/35]
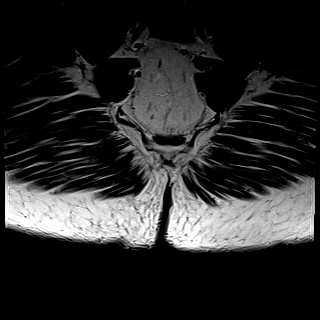

[Series 17: DIXON · coronal · 4.0mm · 0.75mm/px · 5 of 33 slices shown (6 of 6)]
[im 1/33]
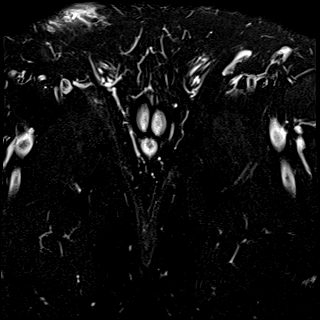
[im 9/33]
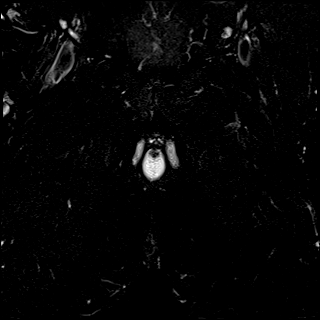
[im 17/33]
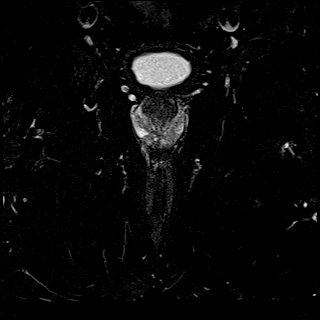
[im 25/33]
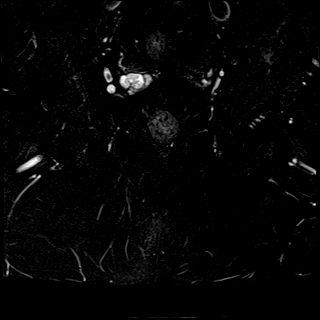
[im 33/33]
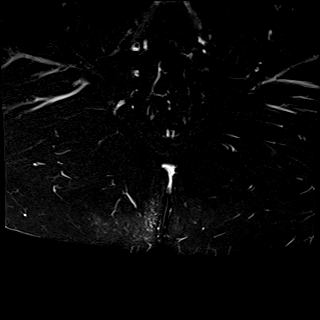

[Series 19: DIXON post-contrast · coronal · 4.0mm · 0.75mm/px · 5 of 35 slices shown (1 of 2)]
[im 1/35]
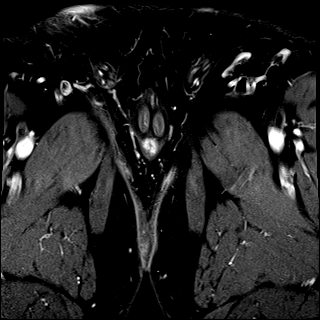
[im 9/35]
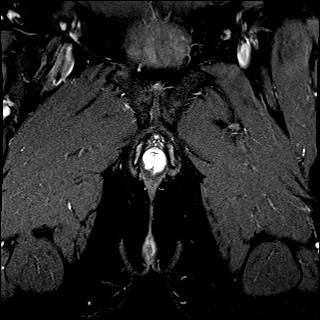
[im 18/35]
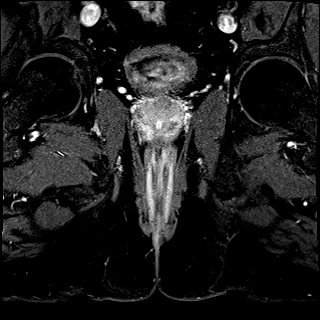
[im 26/35]
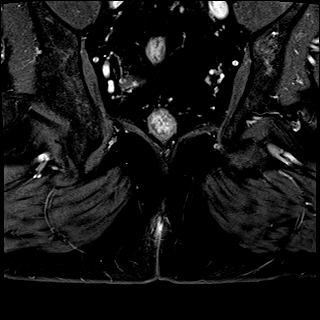
[im 35/35]
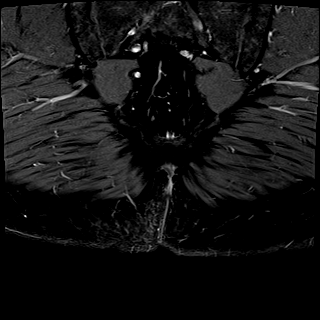

[Series 21: DIXON post-contrast · axial · 4.0mm · 0.69mm/px · z∈[-226,-186]mm · 2 of 30 slices shown (2 of 2)]
[im 1/30]
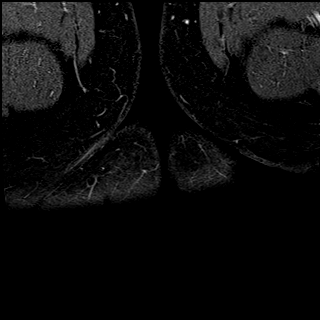
[im 10/30]
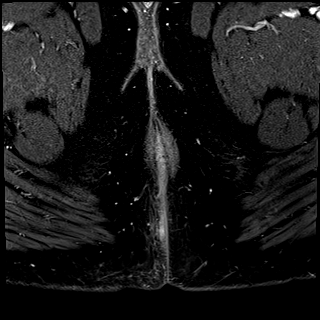

[46 of 48 positions shown; findings below may reference images not displayed]

FINDINGS: Urinary Tract:  No abnormality visualized.

Bowel:  Unremarkable visualized pelvic bowel loops.

Vascular/Lymphatic: No pathologically enlarged lymph nodes. No
significant vascular abnormality seen.

Reproductive:  No mass or other significant abnormality

Other: There is a superficial fluid signal, rim enhancing lesion at
the right aspect of the gluteal cleft, approximately 3 cm posterior
to the anus, measuring approximately 2.0 x 0.7 x 1.6 cm (series 21,
image 20, series 19, image 28).

Musculoskeletal: No suspicious bone lesions identified.
IMPRESSION: There is a superficial fluid signal, rim enhancing lesion at the
right aspect of the gluteal cleft, approximately 3 cm posterior to
the anus, measuring approximately 2.0 x 0.7 x 1.6 cm. This is
consistent with a superficial abscess or boil, without evidence of
perianal fistula or perirectal abscess.

## 2021-04-23 HISTORY — PX: COLONOSCOPY: SHX174

## 2021-06-03 ENCOUNTER — Other Ambulatory Visit: Payer: Self-pay | Admitting: General Surgery

## 2021-06-04 LAB — SURGICAL PATHOLOGY

## 2021-08-23 ENCOUNTER — Other Ambulatory Visit: Payer: Self-pay | Admitting: Otolaryngology

## 2021-08-25 LAB — SURGICAL PATHOLOGY

## 2022-04-15 DIAGNOSIS — K439 Ventral hernia without obstruction or gangrene: Secondary | ICD-10-CM

## 2022-04-15 HISTORY — DX: Ventral hernia without obstruction or gangrene: K43.9

## 2022-05-10 ENCOUNTER — Other Ambulatory Visit: Payer: Self-pay | Admitting: General Surgery

## 2022-05-10 NOTE — Progress Notes (Signed)
Progress Notes - documented in this encounter Daley Mooradian, Geronimo Boot, MD - 05/10/2022 4:00 PM EDT Formatting of this note is different from the original. Subjective:   Patient ID: Manuel Dixon is a 46 y.o. male.  HPI  The following portions of the patient's history were reviewed and updated as appropriate.  This an established patient is here today for: office visit. Here for evaluation of an umbilical hernia referred by Dr Caryl Comes. He states he has had an umbilical knot for 9-92 years. He states he stated having pain about 2 weeks ago, noted after pulling some heavy wire. He states his bowels are routine.  The patient reports she has had no further difficulty post pilonidal cyst excision in 2022.  Review of Systems  Constitutional: Negative for chills and fever.  Respiratory: Negative for cough.   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 FEV1/FVC 67% predicted to be: FEF 25-75% was 63% predicted. Spiriva, albuterol recommended  Hyperglycemia 07/14/2016  Other hyperlipidemia 07/14/2016  Snoring 09/29/2016  Home sleep study 2/18 with RDI 5, AHI 0, with snoring 28.4% of the study. ENT evaluation recommended    Past Surgical History:  Procedure Laterality Date  PILONIDAL CYST / SINUS EXCISION 2014  Rochel Brome, MD  incision and drainage peri-rectal abscess 02/11/2015  Fistula in ano.  EUA, excision recurrent pilonidal disease 01/29/2020  Dr. Christian Mate  PILONIDAL CYST / SINUS EXCISION 03/08/2021  COLONOSCOPY 04/23/2021  Normal colon/Repeat 2yr/TKT  excision skin cyst Right 06/03/2021  chin by Dr BBary Castilla   Social History   Socioeconomic History  Marital status: Single  Tobacco Use  Smoking  status: Every Day  Packs/day: 1.00  Years: 20.00  Pack years: 20.00  Types: Cigarettes  Smokeless tobacco: Never  Vaping Use  Vaping Use: Never used  Substance and Sexual Activity  Alcohol use: Yes  Comment: beer socially  Drug use: Never  Sexual activity: Defer    Allergies  Allergen Reactions  Amoxicillin Vomiting  Chantix [Varenicline] Other (See Comments)  nightmares  Codeine Nausea  Hydrocodone Nausea And Vomiting   Current Outpatient Medications  Medication Sig Dispense Refill  acetaminophen (TYLENOL) 325 MG tablet Take 650 mg by mouth every 4 (four) hours as needed for Pain  albuterol 90 mcg/actuation inhaler Inhale 2 inhalations into the lungs every 4 (four) hours as needed for Wheezing or Shortness of Breath 1 each 1  ibuprofen (MOTRIN) 200 MG tablet Take 200 mg by mouth every 6 (six) hours as needed for Pain  lisinopriL (ZESTRIL) 10 MG tablet TAKE 1 TABLET BY MOUTH ONCE DAILY 90 tablet 3  omega 3-dha-epa-fish oil 1,000 mg (120 mg-180 mg) Cap Take 2,000 mg by mouth once daily  traZODone (DESYREL) 50 MG tablet Take 1 tablet (50 mg total) by mouth at bedtime as needed for Sleep 30 tablet 11  benzonatate (TESSALON) 200 MG capsule Take 1 capsule (200 mg total) by mouth 3 (three) times daily as needed for Cough (Patient not taking: Reported on 05/10/2022) 30 capsule 1  promethazine-dextromethorphan (PROMETHAZINE-DM) 6.25-15 mg/5 mL syrup Take 5 mLs by mouth at bedtime (Patient not taking: Reported on 05/10/2022) 120 mL 0   No current facility-administered medications  for this visit.   Family History  Problem Relation Age of Onset  Myocardial Infarction (Heart attack) Mother  Diabetes type II Father  High blood pressure (Hypertension) Father  Colon cancer Neg Hx   Labs and Radiology:   March 14, 2022 laboratory:  Hemoglobin A1C 4.2 - 5.6 % 5.8 High   WBC (White Blood Cell Count) 4.1 - 10.2 10^3/uL 8.8  RBC (Red Blood Cell Count) 4.69 - 6.13 10^6/uL 5.06  Hemoglobin  14.1 - 18.1 gm/dL 16.4  Hematocrit 40.0 - 52.0 % 47.4  MCV (Mean Corpuscular Volume) 80.0 - 100.0 fl 93.7  MCH (Mean Corpuscular Hemoglobin) 27.0 - 31.2 pg 32.4 High  MCHC (Mean Corpuscular Hemoglobin Concentration) 32.0 - 36.0 gm/dL 34.6  Platelet Count 150 - 450 10^3/uL 209  RDW-CV (Red Cell Distribution Width) 11.6 - 14.8 % 13.0  MPV (Mean Platelet Volume) 9.4 - 12.4 fl 11.9  Neutrophils 1.50 - 7.80 10^3/uL 5.84  Lymphocytes 1.00 - 3.60 10^3/uL 2.03  Monocytes 0.00 - 1.50 10^3/uL 0.63  Eosinophils 0.00 - 0.55 10^3/uL 0.23  Basophils 0.00 - 0.09 10^3/uL 0.07  Neutrophil % 32.0 - 70.0 % 66.3  Lymphocyte % 10.0 - 50.0 % 23.0  Monocyte % 4.0 - 13.0 % 7.1  Eosinophil % 1.0 - 5.0 % 2.6  Basophil% 0.0 - 2.0 % 0.8  Immature Granulocyte % <=0.7 % 0.2  Immature Granulocyte Count <=0.06 10^3/L 0.02   Glucose 70 - 110 mg/dL 103  Sodium 136 - 145 mmol/L 138  Potassium 3.6 - 5.1 mmol/L 4.6  Chloride 97 - 109 mmol/L 105  Carbon Dioxide (CO2) 22.0 - 32.0 mmol/L 24.5  Urea Nitrogen (BUN) 7 - 25 mg/dL 9  Creatinine 0.7 - 1.3 mg/dL 0.8  Glomerular Filtration Rate (eGFR) >60 mL/min/1.73sq m 105  Calcium 8.7 - 10.3 mg/dL 9.8  AST 8 - 39 U/L 39  ALT 6 - 57 U/L 60 High  Alk Phos (alkaline Phosphatase) 34 - 104 U/L 71  Albumin 3.5 - 4.8 g/dL 4.3  Bilirubin, Total 0.3 - 1.2 mg/dL 0.4  Protein, Total 6.1 - 7.9 g/dL 7.3  A/G Ratio 1.0 - 5.0 gm/dL 1.4    Objective:  Physical Exam Constitutional:  Appearance: Normal appearance.  Cardiovascular:  Rate and Rhythm: Normal rate and regular rhythm.  Pulses: Normal pulses.  Heart sounds: Normal heart sounds.  Pulmonary:  Effort: Pulmonary effort is normal.  Breath sounds: Normal breath sounds.  Abdominal:  Tenderness: There is no abdominal tenderness.  Hernia: A hernia is present. Hernia is present in the umbilical area (12 mm fascial defect with adherent 1 cm nodule of omentum.). There is no hernia in the left inguinal area or right inguinal  area.  Musculoskeletal:  Cervical back: Neck supple.  Skin: General: Skin is warm and dry.  Neurological:  Mental Status: He is alert and oriented to person, place, and time.  Psychiatric:  Mood and Affect: Mood normal.  Behavior: Behavior normal.    Assessment:   Umbilical/ventral hernia. Candidate for mesh repair.  Plan:   Indication for elective repair reviewed. Role of prosthetic mesh to minimize possibility of recurrence discussed. 1% increased risk of infection reviewed.  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  Proper lifting technique was reviewed.   This note is partially prepared by Karie Fetch, RN, acting as a scribe in the presence of Dr. Hervey Ard, MD.  The documentation recorded by the scribe accurately reflects the service I personally performed  and the decisions made by me.   Robert Bellow, MD FACS  Electronically signed by Mayer Masker, MD at 05/10/2022 7:30 PM EDT

## 2022-05-16 ENCOUNTER — Encounter
Admission: RE | Admit: 2022-05-16 | Discharge: 2022-05-16 | Disposition: A | Discharge status: Home / Self Care | Payer: BC Managed Care – PPO | Source: Ambulatory Visit | Attending: General Surgery | Admitting: General Surgery

## 2022-05-16 VITALS — Ht 70.0 in | Wt 220.0 lb

## 2022-05-16 DIAGNOSIS — E7849 Other hyperlipidemia: Secondary | ICD-10-CM | POA: Insufficient documentation

## 2022-05-16 DIAGNOSIS — J449 Chronic obstructive pulmonary disease, unspecified: Secondary | ICD-10-CM | POA: Insufficient documentation

## 2022-05-16 DIAGNOSIS — Z01810 Encounter for preprocedural cardiovascular examination: Secondary | ICD-10-CM | POA: Insufficient documentation

## 2022-05-16 DIAGNOSIS — Z01812 Encounter for preprocedural laboratory examination: Secondary | ICD-10-CM

## 2022-05-16 DIAGNOSIS — I10 Essential (primary) hypertension: Secondary | ICD-10-CM

## 2022-05-16 HISTORY — DX: Hyperlipidemia, unspecified: E78.5

## 2022-05-16 HISTORY — DX: Prediabetes: R73.03

## 2022-05-16 HISTORY — DX: Pilonidal cyst without abscess: L05.91

## 2022-05-16 NOTE — Patient Instructions (Addendum)
Your procedure is scheduled on: Monday, October 9 Report to the Registration Desk on the 1st floor of the [facility] To find out your arrival time, please call [phone] between 1PM - 3PM on: Friday, October 6 If your arrival time is 6:00 am, do not arrive prior to that time as the [facility] entrance doors do not open until 6:00 am.  REMEMBER: Instructions that are not followed completely may result in serious medical risk, up to and including death; or upon the discretion of your surgeon and anesthesiologist your surgery may need to be rescheduled.  Do not eat food after midnight the night before surgery.  No gum chewing, lozengers or hard candies.  You may however, drink CLEAR liquids up to 2 hours before you are scheduled to arrive for your surgery. Do not drink anything within 2 hours of your scheduled arrival time.  Clear liquids include: - water  - apple juice without pulp - gatorade (not RED colors) - black coffee or tea (Do NOT add milk or creamers to the coffee or tea) Do NOT drink anything that is not on this list.  DO NOT TAKE ANY MEDICATIONS THE MORNING OF SURGERY   Use YOUR ALBUTEROL inhalers on the day of surgery and bring to the hospital.  One week prior to surgery: starting October 2 Stop Anti-inflammatories (NSAIDS) such as Advil, Aleve, Ibuprofen, Motrin, Naproxen, Naprosyn and Aspirin based products such as Excedrin, Goodys Powder, BC Powder. Stop ANY OVER THE COUNTER supplements until after surgery. You may however, continue to take Tylenol if needed for pain up until the day of surgery.  No Alcohol for 24 hours before or after surgery.  No Smoking including e-cigarettes for 24 hours prior to surgery.  No chewable tobacco products for at least 6 hours prior to surgery.  No nicotine patches on the day of surgery.  Do not use any "recreational" drugs for at least a week prior to your surgery.  Please be advised that the combination of cocaine and  anesthesia may have negative outcomes, up to and including death. If you test positive for cocaine, your surgery will be cancelled.  On the morning of surgery brush your teeth with toothpaste and water, you may rinse your mouth with mouthwash if you wish. Do not swallow any toothpaste or mouthwash.  Use CHG Soap as directed on instruction sheet.  Do not wear jewelry.  Do not wear lotions, powders, or perfumes.   Do not shave body from the neck down 48 hours prior to surgery just in case you cut yourself which could leave a site for infection.  Also, freshly shaved skin may become irritated if using the CHG soap.  Do not bring valuables to the hospital. [facility] is not responsible for any missing/lost belongings or valuables.   Notify your doctor if there is any change in your medical condition (cold, fever, infection).  Wear comfortable clothing (specific to your surgery type) to the hospital.  After surgery, you can help prevent lung complications by doing breathing exercises.  Take deep breaths and cough every 1-2 hours. Your doctor may order a device called an Incentive Spirometer to help you take deep breaths. When coughing or sneezing, hold a pillow firmly against your incision with both hands. This is called "splinting." Doing this helps protect your incision. It also decreases belly discomfort.  If you are being discharged the day of surgery, you will not be allowed to drive home. You will need a responsible adult (  18 years or older) to drive you home and stay with you that night.   If you are taking public transportation, you will need to have a responsible adult (18 years or older) with you. Please confirm with your physician that it is acceptable to use public transportation.   Please call the [facility] at [phone] if you have any questions about these instructions.  Surgery Visitation Policy:  Patients undergoing a surgery or procedure may  have two family members or support persons with them as long as the person is not COVID-19 positive or experiencing its symptoms.      Preparing for Surgery with CHLORHEXIDINE GLUCONATE (CHG) Soap  Chlorhexidine Gluconate (CHG) Soap  o An antiseptic cleaner that kills germs and bonds with the skin to continue killing germs even after washing  o Used for showering the night before surgery and morning of surgery  Before surgery, you can play an important role by reducing the number of germs on your skin.  CHG (Chlorhexidine gluconate) soap is an antiseptic cleanser which kills germs and bonds with the skin to continue killing germs even after washing.  Please do not use if you have an allergy to CHG or antibacterial soaps. If your skin becomes reddened/irritated stop using the CHG.  1. Shower the NIGHT BEFORE SURGERY and the MORNING OF SURGERY with CHG soap.  2. If you choose to wash your hair, wash your hair first as usual with your normal shampoo.  3. After shampooing, rinse your hair and body thoroughly to remove the shampoo.  4. Use CHG as you would any other liquid soap. You can apply CHG directly to the skin and wash gently with a scrungie or a clean washcloth.  5. Apply the CHG soap to your body only from the neck down. Do not use on open wounds or open sores. Avoid contact with your eyes, ears, mouth, and genitals (private parts). Wash face and genitals (private parts) with your normal soap.  6. Wash thoroughly, paying special attention to the area where your surgery will be performed.  7. Thoroughly rinse your body with warm water.  8. Do not shower/wash with your normal soap after using and rinsing off the CHG soap.  9. Pat yourself dry with a clean towel.  10. Wear clean pajamas to bed the night before surgery.  12. Place clean sheets on your bed the night of your first shower and do not sleep with pets.  13. Shower again with the CHG soap on the day of surgery prior  to arriving at the hospital.  14. Do not apply any deodorants/lotions/powders.  15. Please wear clean clothes to the hospital.

## 2022-05-23 ENCOUNTER — Ambulatory Visit: Payer: BC Managed Care – PPO | Admitting: Anesthesiology

## 2022-05-23 ENCOUNTER — Ambulatory Visit
Admission: RE | Admit: 2022-05-23 | Discharge: 2022-05-23 | Disposition: A | Discharge status: Home / Self Care | Payer: BC Managed Care – PPO | Attending: General Surgery | Admitting: General Surgery

## 2022-05-23 ENCOUNTER — Encounter: Payer: Self-pay | Admitting: General Surgery

## 2022-05-23 ENCOUNTER — Other Ambulatory Visit: Payer: Self-pay

## 2022-05-23 ENCOUNTER — Encounter
Admission: RE | Disposition: A | Discharge status: Home / Self Care | Payer: Self-pay | Source: Home / Self Care | Attending: General Surgery

## 2022-05-23 DIAGNOSIS — Z6831 Body mass index (BMI) 31.0-31.9, adult: Secondary | ICD-10-CM | POA: Diagnosis not present

## 2022-05-23 DIAGNOSIS — I10 Essential (primary) hypertension: Secondary | ICD-10-CM | POA: Insufficient documentation

## 2022-05-23 DIAGNOSIS — E669 Obesity, unspecified: Secondary | ICD-10-CM | POA: Insufficient documentation

## 2022-05-23 DIAGNOSIS — F17210 Nicotine dependence, cigarettes, uncomplicated: Secondary | ICD-10-CM | POA: Insufficient documentation

## 2022-05-23 DIAGNOSIS — K429 Umbilical hernia without obstruction or gangrene: Secondary | ICD-10-CM | POA: Diagnosis present

## 2022-05-23 DIAGNOSIS — J449 Chronic obstructive pulmonary disease, unspecified: Secondary | ICD-10-CM | POA: Insufficient documentation

## 2022-05-23 HISTORY — PX: INSERTION OF MESH: SHX5868

## 2022-05-23 HISTORY — PX: VENTRAL HERNIA REPAIR: SHX424

## 2022-05-23 SURGERY — REPAIR, HERNIA, VENTRAL
Anesthesia: General | Site: Abdomen

## 2022-05-23 MED ORDER — LIDOCAINE HCL (CARDIAC) PF 100 MG/5ML IV SOSY
PREFILLED_SYRINGE | INTRAVENOUS | Status: DC | PRN
  Administered 2022-05-23: 50 mg via INTRAVENOUS

## 2022-05-23 MED ORDER — FENTANYL CITRATE (PF) 100 MCG/2ML IJ SOLN
INTRAMUSCULAR | Status: AC
  Administered 2022-05-23: 25 ug via INTRAVENOUS
  Filled 2022-05-23: qty 2

## 2022-05-23 MED ORDER — ROCURONIUM BROMIDE 100 MG/10ML IV SOLN
INTRAVENOUS | Status: DC | PRN
  Administered 2022-05-23: 20 mg via INTRAVENOUS
  Administered 2022-05-23: 40 mg via INTRAVENOUS

## 2022-05-23 MED ORDER — ORAL CARE MOUTH RINSE: 15.0000 mL | Freq: Once | OROMUCOSAL | Status: AC

## 2022-05-23 MED ORDER — ONDANSETRON HCL 4 MG/2ML IJ SOLN
INTRAMUSCULAR | Status: DC | PRN
  Administered 2022-05-23: 4 mg via INTRAVENOUS

## 2022-05-23 MED ORDER — MIDAZOLAM HCL 2 MG/2ML IJ SOLN
INTRAMUSCULAR | Status: DC | PRN
  Administered 2022-05-23: 2 mg via INTRAVENOUS

## 2022-05-23 MED ORDER — LACTATED RINGERS IV SOLN: INTRAVENOUS | Status: DC

## 2022-05-23 MED ORDER — CHLORHEXIDINE GLUCONATE CLOTH 2 % EX PADS: 6.0000 | MEDICATED_PAD | Freq: Once | CUTANEOUS | Status: DC

## 2022-05-23 MED ORDER — ONDANSETRON HCL 4 MG/2ML IJ SOLN: 4.0000 mg | Freq: Once | INTRAMUSCULAR | Status: DC | PRN

## 2022-05-23 MED ORDER — FENTANYL CITRATE (PF) 100 MCG/2ML IJ SOLN
25.0000 ug | INTRAMUSCULAR | Status: DC | PRN
  Administered 2022-05-23: 25 ug via INTRAVENOUS

## 2022-05-23 MED ORDER — FENTANYL CITRATE (PF) 100 MCG/2ML IJ SOLN
INTRAMUSCULAR | Status: DC | PRN
  Administered 2022-05-23 (×2): 50 ug via INTRAVENOUS

## 2022-05-23 MED ORDER — CEFAZOLIN SODIUM-DEXTROSE 2-4 GM/100ML-% IV SOLN
INTRAVENOUS | Status: AC
  Filled 2022-05-23: qty 100

## 2022-05-23 MED ORDER — GLYCOPYRROLATE 0.2 MG/ML IJ SOLN
INTRAMUSCULAR | Status: DC | PRN
  Administered 2022-05-23: .2 mg via INTRAVENOUS

## 2022-05-23 MED ORDER — ACETAMINOPHEN 10 MG/ML IV SOLN: 1000.0000 mg | Freq: Once | INTRAVENOUS | Status: DC | PRN

## 2022-05-23 MED ORDER — OXYCODONE HCL 5 MG PO TABS
ORAL_TABLET | ORAL | Status: AC
  Filled 2022-05-23: qty 1

## 2022-05-23 MED ORDER — FAMOTIDINE 20 MG PO TABS: 20.0000 mg | ORAL_TABLET | Freq: Once | ORAL | Status: AC

## 2022-05-23 MED ORDER — DEXAMETHASONE SODIUM PHOSPHATE 10 MG/ML IJ SOLN
INTRAMUSCULAR | Status: AC
  Filled 2022-05-23: qty 1

## 2022-05-23 MED ORDER — DEXAMETHASONE SODIUM PHOSPHATE 10 MG/ML IJ SOLN
INTRAMUSCULAR | Status: DC | PRN
  Administered 2022-05-23: 10 mg via INTRAVENOUS

## 2022-05-23 MED ORDER — ACETAMINOPHEN 10 MG/ML IV SOLN
INTRAVENOUS | Status: AC
  Filled 2022-05-23: qty 100

## 2022-05-23 MED ORDER — MIDAZOLAM HCL 2 MG/2ML IJ SOLN
INTRAMUSCULAR | Status: AC
  Filled 2022-05-23: qty 2

## 2022-05-23 MED ORDER — CHLORHEXIDINE GLUCONATE 0.12 % MT SOLN
OROMUCOSAL | Status: AC
  Administered 2022-05-23: 15 mL via OROMUCOSAL
  Filled 2022-05-23: qty 15

## 2022-05-23 MED ORDER — TRAMADOL HCL 50 MG PO TABS: 50.0000 mg | ORAL_TABLET | ORAL | 0 refills | Status: AC | PRN

## 2022-05-23 MED ORDER — KETOROLAC TROMETHAMINE 30 MG/ML IJ SOLN
INTRAMUSCULAR | Status: DC | PRN
  Administered 2022-05-23: 30 mg via INTRAVENOUS

## 2022-05-23 MED ORDER — FAMOTIDINE 20 MG PO TABS
ORAL_TABLET | ORAL | Status: AC
  Administered 2022-05-23: 20 mg via ORAL
  Filled 2022-05-23: qty 1

## 2022-05-23 MED ORDER — ONDANSETRON HCL 4 MG/2ML IJ SOLN
INTRAMUSCULAR | Status: AC
  Filled 2022-05-23: qty 2

## 2022-05-23 MED ORDER — CHLORHEXIDINE GLUCONATE 0.12 % MT SOLN: 15.0000 mL | Freq: Once | OROMUCOSAL | Status: AC

## 2022-05-23 MED ORDER — SUGAMMADEX SODIUM 200 MG/2ML IV SOLN
INTRAVENOUS | Status: DC | PRN
  Administered 2022-05-23: 200 mg via INTRAVENOUS

## 2022-05-23 MED ORDER — BUPIVACAINE-EPINEPHRINE (PF) 0.5% -1:200000 IJ SOLN
INTRAMUSCULAR | Status: AC
  Filled 2022-05-23: qty 30

## 2022-05-23 MED ORDER — BUPIVACAINE-EPINEPHRINE (PF) 0.5% -1:200000 IJ SOLN
INTRAMUSCULAR | Status: DC | PRN
  Administered 2022-05-23: 30 mL

## 2022-05-23 MED ORDER — GLYCOPYRROLATE 0.2 MG/ML IJ SOLN
INTRAMUSCULAR | Status: AC
  Filled 2022-05-23: qty 1

## 2022-05-23 MED ORDER — PROPOFOL 10 MG/ML IV BOLUS
INTRAVENOUS | Status: DC | PRN
  Administered 2022-05-23: 150 mg via INTRAVENOUS

## 2022-05-23 MED ORDER — ROCURONIUM BROMIDE 10 MG/ML (PF) SYRINGE
PREFILLED_SYRINGE | INTRAVENOUS | Status: AC
  Filled 2022-05-23: qty 10

## 2022-05-23 MED ORDER — PROPOFOL 10 MG/ML IV BOLUS
INTRAVENOUS | Status: AC
  Filled 2022-05-23: qty 20

## 2022-05-23 MED ORDER — CEFAZOLIN SODIUM-DEXTROSE 2-4 GM/100ML-% IV SOLN
2.0000 g | INTRAVENOUS | Status: AC
  Administered 2022-05-23: 2 g via INTRAVENOUS

## 2022-05-23 MED ORDER — OXYCODONE HCL 5 MG PO TABS
5.0000 mg | ORAL_TABLET | Freq: Once | ORAL | Status: AC | PRN
  Administered 2022-05-23: 5 mg via ORAL

## 2022-05-23 MED ORDER — KETOROLAC TROMETHAMINE 30 MG/ML IJ SOLN
INTRAMUSCULAR | Status: AC
  Filled 2022-05-23: qty 1

## 2022-05-23 MED ORDER — FENTANYL CITRATE (PF) 100 MCG/2ML IJ SOLN
INTRAMUSCULAR | Status: AC
  Filled 2022-05-23: qty 2

## 2022-05-23 MED ORDER — ACETAMINOPHEN 10 MG/ML IV SOLN
INTRAVENOUS | Status: DC | PRN
  Administered 2022-05-23: 1000 mg via INTRAVENOUS

## 2022-05-23 MED ORDER — OXYCODONE HCL 5 MG/5ML PO SOLN: 5.0000 mg | Freq: Once | ORAL | Status: AC | PRN

## 2022-05-23 MED ORDER — DEXMEDETOMIDINE HCL IN NACL 80 MCG/20ML IV SOLN
INTRAVENOUS | Status: DC | PRN
  Administered 2022-05-23: 8 ug via BUCCAL
  Administered 2022-05-23: 12 ug via BUCCAL

## 2022-05-23 SURGICAL SUPPLY — 41 items
APL PRP STRL LF DISP 70% ISPRP (MISCELLANEOUS) ×1
APL SKNCLS STERI-STRIP NONHPOA (GAUZE/BANDAGES/DRESSINGS) ×1
BENZOIN TINCTURE PRP APPL 2/3 (GAUZE/BANDAGES/DRESSINGS) ×2 IMPLANT
BLADE SURG 15 STRL SS SAFETY (BLADE) ×2 IMPLANT
BULB RESERV EVAC DRAIN JP 100C (MISCELLANEOUS) IMPLANT
CHLORAPREP W/TINT 26 (MISCELLANEOUS) ×2 IMPLANT
DRAIN CHANNEL JP 15F RND 16 (MISCELLANEOUS) IMPLANT
DRAPE LAPAROTOMY TRNSV 106X77 (MISCELLANEOUS) ×2 IMPLANT
DRSG OPSITE POSTOP 4X6 (GAUZE/BANDAGES/DRESSINGS) ×2 IMPLANT
ELECT REM PT RETURN 9FT ADLT (ELECTROSURGICAL) ×1
ELECTRODE REM PT RTRN 9FT ADLT (ELECTROSURGICAL) ×2 IMPLANT
GAUZE 4X4 16PLY ~~LOC~~+RFID DBL (SPONGE) ×2 IMPLANT
GLOVE BIO SURGEON STRL SZ7.5 (GLOVE) ×2 IMPLANT
GLOVE SURG UNDER LTX SZ8 (GLOVE) ×2 IMPLANT
GOWN STRL REUS W/ TWL LRG LVL3 (GOWN DISPOSABLE) ×4 IMPLANT
GOWN STRL REUS W/TWL LRG LVL3 (GOWN DISPOSABLE) ×2
KIT TURNOVER KIT A (KITS) ×2 IMPLANT
LABEL OR SOLS (LABEL) ×2 IMPLANT
MANIFOLD NEPTUNE II (INSTRUMENTS) ×2 IMPLANT
MESH VENTRALEX ST 8CM LRG (Mesh General) IMPLANT
NDL HYPO 25X1 1.5 SAFETY (NEEDLE) ×2 IMPLANT
NEEDLE HYPO 22GX1.5 SAFETY (NEEDLE) ×2 IMPLANT
NEEDLE HYPO 25X1 1.5 SAFETY (NEEDLE) ×1 IMPLANT
NS IRRIG 500ML POUR BTL (IV SOLUTION) ×2 IMPLANT
PACK BASIN MINOR ARMC (MISCELLANEOUS) ×2 IMPLANT
SPONGE T-LAP 18X18 ~~LOC~~+RFID (SPONGE) ×2 IMPLANT
STAPLER SKIN PROX 35W (STAPLE) IMPLANT
STRIP CLOSURE SKIN 1/2X4 (GAUZE/BANDAGES/DRESSINGS) ×2 IMPLANT
SUT MAXON ABS #0 GS21 30IN (SUTURE) IMPLANT
SUT SURGILON 0 BLK (SUTURE) ×2 IMPLANT
SUT VIC AB 0 CT1 36 (SUTURE) ×2 IMPLANT
SUT VIC AB 2-0 BRD 54 (SUTURE) ×2 IMPLANT
SUT VIC AB 2-0 CT1 (SUTURE) IMPLANT
SUT VIC AB 3-0 54X BRD REEL (SUTURE) ×2 IMPLANT
SUT VIC AB 3-0 BRD 54 (SUTURE) ×1
SUT VIC AB 3-0 SH 27 (SUTURE) ×1
SUT VIC AB 3-0 SH 27X BRD (SUTURE) ×2 IMPLANT
SUT VIC AB 4-0 FS2 27 (SUTURE) ×2 IMPLANT
SYR 10ML LL (SYRINGE) ×2 IMPLANT
TRAP FLUID SMOKE EVACUATOR (MISCELLANEOUS) ×2 IMPLANT
TRAY FOLEY MTR SLVR 16FR STAT (SET/KITS/TRAYS/PACK) IMPLANT

## 2022-05-23 NOTE — Anesthesia Postprocedure Evaluation (Signed)
Anesthesia Post Note  Patient: Manuel Dixon  Procedure(s) Performed: HERNIA REPAIR VENTRAL ADULT (Abdomen) INSERTION OF MESH (Abdomen)  Patient location during evaluation: PACU Anesthesia Type: General Level of consciousness: awake and alert Pain management: pain level controlled Vital Signs Assessment: post-procedure vital signs reviewed and stable Respiratory status: spontaneous breathing, nonlabored ventilation, respiratory function stable and patient connected to nasal cannula oxygen Cardiovascular status: blood pressure returned to baseline and stable Postop Assessment: no apparent nausea or vomiting Anesthetic complications: no   No notable events documented.   Last Vitals:  Vitals:   05/23/22 1059 05/23/22 1116  BP: 118/85 110/81  Pulse: 84 63  Resp: 14 16  Temp: 36.7 C 36.7 C  SpO2: 97% 97%    Last Pain:  Vitals:   05/23/22 1116  TempSrc: Temporal  PainSc: [place]

## 2022-05-23 NOTE — Discharge Instructions (Addendum)

## 2022-05-23 NOTE — OR Nursing (Signed)
Per OR Room #8 per Dr Bary Castilla, patient may be discharged to home without his visit to postop.

## 2022-05-23 NOTE — Op Note (Signed)
Preoperative diagnosis: Symptomatic umbilical hernia.  Postoperative diagnosis: Same.  Umbilical hernia repair with 8 cm Ventralight ST mesh.  Operating surgeon: Hervey Ard, MD.  Anesthesia: General endotracheal, Marcaine 0.5% with 1: 200,000 units of epinephrine.  Estimated blood loss: Less than 5 cc.  Clinical note: This 46 year old male developed a symptomatic umbilical hernia.  It is reducible except for a small nodule of omental fat.  He is admitted for elective repair.  He received Ancef prior to the procedure.  SCD stockings for DVT prevention.  Hair was removed from the surgical site with clippers prior to presentation to the operating theater.  Operative note: Patient tolerated general anesthesia well.  The abdomen was cleansed with ChloraPrep and draped.  An infraumbilical incision was made sharply.  The remaining dissection was done with scissors to elevate the umbilical skin off the underlying hernia sac.  The sac was transected at the level of the fascia excised and discarded after division of adhesions of the omentum to the sac.  The undersurface of the fascia was then cleared circumferentially.  The fascial defect measured 2.5 cm in diameter.  Considering the patient's smoking history, size and fascial defect it was elected to make use of an 8 cm Ventralight ST mesh.  The mesh was smoothed in position along the undersurface of the posterior rectus sheath.  The hernia defect was closed with interrupted 0 Surgilon's in a transverse fashion.  The mesh was grasped with each suture passage.  Suture was were placed under direct vision and then tied sequentially.  The umbilical skin was tacked to the fascia with a 3-0 Vicryl figure-of-eight suture.  The adipose layer was closed with a running 3-0 Vicryl suture.  The skin closed with a running 4-0 Vicryl subcuticular suture.  Benzoin, Steri-Strips, Telfa and Tegaderm dressing were applied.  Patient tolerated procedure well and was taken  to PACU in stable condition.

## 2022-05-23 NOTE — H&P (Signed)
Manuel Dixon [id number] 1976/04/19     HPI:  46 y/o male with ventral hernia. For mesh repair.   Medications Prior to Admission  Medication Sig Dispense Refill Last Dose   albuterol (PROVENTIL HFA;VENTOLIN HFA) 108 (90 Base) MCG/ACT inhaler Inhale 2 puffs into the lungs every 4 (four) hours as needed for wheezing.    05/23/2022   ibuprofen (ADVIL) 200 MG tablet Take 400 mg by mouth every 6 (six) hours as needed for mild pain or moderate pain.   Past Month   lisinopril (ZESTRIL) 10 MG tablet Take 1 tablet (10 mg total) by mouth daily. 60 tablet 0 05/22/2022   traZODone (DESYREL) 50 MG tablet Take 50 mg by mouth at bedtime as needed for sleep. (Patient not taking: Reported on 05/23/2022)   Not Taking   Allergies  Allergen Reactions   Amoxil [Amoxicillin] Nausea And Vomiting   Codeine Nausea Only   Hydrocodone Nausea And Vomiting   Past Medical History:  Diagnosis Date   COPD (chronic obstructive pulmonary disease) ([place])    Hyperlipidemia    Hypertension    Pilonidal cyst    Pneumonia 2015   Pre-diabetes    Ventral hernia 04/2022   Past Surgical History:  Procedure Laterality Date   COLONOSCOPY  04/23/2021   INCISION AND DRAINAGE PERIRECTAL ABSCESS N/A 02/11/2015   Procedure: IRRIGATION AND DEBRIDEMENT PERIRECTAL ABSCESS;  Surgeon: Robert Bellow, MD;  Location: [facility] ORS;  Service: General;  Laterality: N/A;   PILONIDAL CYST EXCISION  08/15/2012   Dr Tamala Julian   PILONIDAL CYST EXCISION N/A 03/08/2021   Procedure: CYST EXCISION PILONIDAL EXTENSIVE;  Surgeon: Robert Bellow, MD;  Location: [facility] ORS;  Service: General;  Laterality: N/A;  General/Endotrachea; pilonidal cyst with rotation flap   PILONIDAL CYST EXCISION  01/29/2020   Social History   Socioeconomic History   Marital status: Married    Spouse name: Colletta [place]   Number of children: 1   Years of education: Not on file   Highest education level: Not on file  Occupational History   Not on file  Tobacco Use    Smoking status: Every Day    Packs/day: 1.00    Years: 20.00    Total pack years: 20.00    Types: Cigarettes   Smokeless tobacco: Never  Vaping Use   Vaping Use: Never used  Substance and Sexual Activity   Alcohol use: Yes    Alcohol/week: 6.0 - 12.0 standard drinks of alcohol    Types: 6 - 12 Standard drinks or equivalent per week    Comment: weekend drinking   Drug use: No   Sexual activity: Yes  Other Topics Concern   Not on file  Social History Narrative   Not on file   Social Determinants of Health   Financial Resource Strain: Not on file  Food Insecurity: Not on file  Transportation Needs: Not on file  Physical Activity: Not on file  Stress: Not on file  Social Connections: Not on file  Intimate Partner Violence: Not on file   Social History   Social History Narrative   Not on file     ROS: Negative.     PE: HEENT: Negative. Lungs: Clear. Cardio: RR.   Assessment/Plan:  Proceed with planned  ventral hernia repair with mesh.  Forest Gleason Chinyere Galiano 05/23/2022

## 2022-05-23 NOTE — Anesthesia Preprocedure Evaluation (Signed)
Anesthesia Evaluation  Patient identified by MRN, date of birth, ID band Patient awake    Reviewed: Allergy & Precautions, NPO status , Patient's Chart, lab work & pertinent test results  History of Anesthesia Complications Negative for: history of anesthetic complications  Airway Mallampati: II   Neck ROM: Full    Dental  (+) Missing   Pulmonary COPD, Current Smoker (1 ppd) and Patient abstained from smoking.,    Pulmonary exam normal breath sounds clear to auscultation       Cardiovascular hypertension, Normal cardiovascular exam Rhythm:Regular Rate:Normal  ECG 05/16/22: normal   Neuro/Psych negative neurological ROS     GI/Hepatic negative GI ROS,   Endo/Other  Prediabetes, obesity  Renal/GU negative Renal ROS     Musculoskeletal   Abdominal   Peds  Hematology negative hematology ROS (+)   Anesthesia Other Findings   Reproductive/Obstetrics                             Anesthesia Physical Anesthesia Plan  ASA: 2  Anesthesia Plan: General   Post-op Pain Management:    Induction: Intravenous  PONV Risk Score and Plan: 1 and Ondansetron, Dexamethasone and Treatment may vary due to age or medical condition  Airway Management Planned: Oral ETT  Additional Equipment:   Intra-op Plan:   Post-operative Plan: Extubation in OR  Informed Consent: I have reviewed the patients History and Physical, chart, labs and discussed the procedure including the risks, benefits and alternatives for the proposed anesthesia with the patient or authorized representative who has indicated his/her understanding and acceptance.     Dental advisory given  Plan Discussed with: CRNA  Anesthesia Plan Comments: (Patient consented for risks of anesthesia including but not limited to:  - adverse reactions to medications - damage to eyes, teeth, lips or other oral mucosa - nerve damage due to  positioning  - sore throat or hoarseness - damage to heart, brain, nerves, lungs, other parts of body or loss of life  Informed patient about role of CRNA in peri- and intra-operative care.  Patient voiced understanding.)        Anesthesia Quick Evaluation

## 2022-05-23 NOTE — Transfer of Care (Signed)
Immediate Anesthesia Transfer of Care Note  Patient: NYKEEM CITRO  Procedure(s) Performed: HERNIA REPAIR VENTRAL ADULT (Abdomen) INSERTION OF MESH (Abdomen)  Patient Location: PACU  Anesthesia Type:General  Level of Consciousness: drowsy and patient cooperative  Airway & Oxygen Therapy: Patient Spontanous Breathing and Patient connected to face mask oxygen  Post-op Assessment: Report given to RN and Post -op Vital signs reviewed and stable  Post vital signs: Reviewed and stable  Last Vitals:  Vitals Value Taken Time  BP 108/76 05/23/22 1007  Temp 36.9 C 05/23/22 1007  Pulse 75 05/23/22 1010  Resp 13 05/23/22 1010  SpO2 100 % 05/23/22 1010  Vitals shown include unvalidated device data.  Last Pain:  Vitals:   05/23/22 0740  TempSrc: Temporal  PainSc: 0-No pain         Complications: No notable events documented.

## 2022-05-23 NOTE — Anesthesia Procedure Notes (Signed)
Procedure Name: Intubation Date/Time: 05/23/2022 9:15 AM  Performed by: Jonna Clark, CRNAPre-anesthesia Checklist: Patient identified, Patient being monitored, Timeout performed, Emergency Drugs available and Suction available Patient Re-evaluated:Patient Re-evaluated prior to induction Oxygen Delivery Method: Circle system utilized Preoxygenation: Pre-oxygenation with 100% oxygen Induction Type: IV induction Ventilation: Mask ventilation without difficulty Laryngoscope Size: Mac and 4 Grade View: Grade I Tube type: Oral Tube size: 7.5 mm Number of attempts: 2 Airway Equipment and Method: Stylet Placement Confirmation: ETT inserted through vocal cords under direct vision, positive ETCO2 and breath sounds checked- equal and bilateral Secured at: 23 cm Tube secured with: Tape Dental Injury: Teeth and Oropharynx as per pre-operative assessment

## 2022-05-24 ENCOUNTER — Encounter: Payer: Self-pay | Admitting: General Surgery

## 2024-06-06 ENCOUNTER — Ambulatory Visit: Payer: Self-pay | Admitting: General Surgery

## 2024-06-06 NOTE — Progress Notes (Signed)
 History of Present Illness Manuel Dixon is a 48 year old male who presents with hidradenitis suppurativa in the right groin and left buttock. He was referred to [facility] for dermatological evaluation.  He has a history of skin problems affecting his right groin and left buttock. He has experienced similar issues in the past but has not had any prior interventions for these lesions. The current episode began on Saturday, with the lesion in the right groin area becoming noticeable at that time. Today is the first day it has not drained.  He is currently taking doxycycline , an antibiotic, and has extended the dosage. Despite the antibiotic treatment, the lesion in the groin area became prominent after starting the medication.  He has a history of four surgeries for infected cyst and pilonidal cysts, although he states that these are unrelated to his current skin condition. He had not received a formal diagnosis for his skin issues until now and was unfamiliar with the term 'hidradenitis suppurativa' prior to this visit.  He has not yet had an appointment with the dermatologist at [facility], as the referral process can take several months to a year.      PAST MEDICAL HISTORY:  Past Medical History:  Diagnosis Date  . Benign hypertension 07/14/2016  . 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  FEV1/FVC 67% predicted to be: FEF 25-75% was 63% predicted.  Spiriva, albuterol recommended  . Hyperglycemia 07/14/2016  . Other hyperlipidemia 07/14/2016  . Snoring 09/29/2016   Home sleep study 2/18 with RDI 5, AHI 0, with snoring 28.4% of the study.  ENT evaluation recommended        PAST SURGICAL HISTORY:   Past Surgical History:  Procedure Laterality Date  . PILONIDAL CYST / SINUS EXCISION  2014   Unknown Sharps, MD  . incision and drainage peri-rectal abscess  02/11/2015   Fistula in ano.  . EUA, excision recurrent pilonidal disease  01/29/2020   Dr. Lane  . PILONIDAL  CYST / SINUS EXCISION  03/08/2021  . COLONOSCOPY  04/23/2021   Normal colon/Repeat 26yrs/TKT  . excision skin cyst Right 06/03/2021   chin by Dr Dessa  . OPEN VENTRAL HERNIA REPAIR   05/23/2022   8 cm Ventralight ST mesh.         MEDICATIONS:  Outpatient Encounter Medications as of 06/06/2024  Medication Sig Dispense Refill  . acetaminophen  (TYLENOL ) 325 MG tablet Take 650 mg by mouth every 4 (four) hours as needed for Pain    . albuterol 90 mcg/actuation inhaler Inhale 2 inhalations into the lungs every 4 (four) hours as needed for Wheezing or Shortness of Breath 1 each 1  . doxycycline  (VIBRAMYCIN ) 100 MG capsule Take 100 mg by mouth 2 (two) times daily    . ibuprofen  (MOTRIN ) 200 MG tablet Take 200 mg by mouth every 6 (six) hours as needed for Pain    . lisinopriL  (ZESTRIL ) 10 MG tablet TAKE 1 TABLET BY MOUTH ONCE DAILY 90 tablet 3  . nicotine (NICODERM CQ) 21 mg/24 hr patch Place 1 patch onto the skin daily 28 patch 1  . omega 3-dha-epa-fish oil 1,000 mg (120 mg-180 mg) Cap Take 2,000 mg by mouth once daily    . rosuvastatin (CRESTOR) 10 MG tablet Take 1 tablet (10 mg total) by mouth once daily 90 tablet 3   No facility-administered encounter medications on file as of 06/06/2024.     ALLERGIES:   Amoxicillin, Chantix [varenicline], Codeine, and Hydrocodone  SOCIAL HISTORY:  Social History   Socioeconomic History  . Marital status: Single  Tobacco Use  . Smoking status: Every Day    Current packs/day: 1.00    Average packs/day: 1 pack/day for 20.0 years (20.0 ttl pk-yrs)    Types: Cigarettes  . Smokeless tobacco: Never  Vaping Use  . Vaping status: Never Used  Substance and Sexual Activity  . Alcohol use: Yes    Comment: beer socially  . Drug use: Never  . Sexual activity: Defer   Social Drivers of Health   Financial Resource Strain: Low Risk  (05/25/2023)   Overall Financial Resource Strain (CARDIA)   . Difficulty of Paying Living Expenses: Not hard at all   Food Insecurity: No Food Insecurity (05/25/2023)   Hunger Vital Sign   . Worried About Programme researcher, broadcasting/film/video in the Last Year: Never true   . Ran Out of Food in the Last Year: Never true  Transportation Needs: No Transportation Needs (05/25/2023)   PRAPARE - Transportation   . Lack of Transportation (Medical): No   . Lack of Transportation (Non-Medical): No    FAMILY HISTORY:  Family History  Problem Relation Name Age of Onset  . Myocardial Infarction (Heart attack) Mother    . Diabetes type II Father    . High blood pressure (Hypertension) Father    . Colon cancer Neg Hx       GENERAL REVIEW OF SYSTEMS:   General ROS: negative for - chills, fatigue, fever, weight gain or weight loss Allergy and Immunology ROS: negative for - hives  Hematological and Lymphatic ROS: negative for - bleeding problems or bruising, negative for palpable nodes Endocrine ROS: negative for - heat or cold intolerance, hair changes Respiratory ROS: negative for - cough, shortness of breath or wheezing Cardiovascular ROS: no chest pain or palpitations GI ROS: negative for nausea, vomiting, abdominal pain, diarrhea, constipation Musculoskeletal ROS: negative for - joint swelling or muscle pain Neurological ROS: negative for - confusion, syncope Dermatological ROS: negative for pruritus and rash  PHYSICAL EXAM:  Vitals:   06/06/24 0750  BP: (!) 136/91  Pulse: 88  .  Ht:177.8 cm (5' 10) Wt:98.4 kg (217 lb) ADJ:Anib surface area is 2.2 meters squared. Body mass index is 31.14 kg/m.SABRA   GENERAL: Alert, active, oriented x3  HEENT: Pupils equal reactive to light. Extraocular movements are intact. Sclera clear. Palpebral conjunctiva normal red color.Pharynx clear.  NECK: Supple with no palpable mass and no adenopathy.  LUNGS: Sound clear with no rales rhonchi or wheezes.  HEART: Regular rhythm S1 and S2 without murmur.  ABDOMEN: Soft and depressible, nontender with no palpable mass, no hepatomegaly.    GENITAL: Right groin hidradenitis with acute inflammation and forming abscess.  Left buttock, genital infected hidradenitis  EXTREMITIES: Well-developed well-nourished symmetrical with no dependent edema.  NEUROLOGICAL: Awake alert oriented, facial expression symmetrical, moving all extremities.   Assessment & Plan Hidradenitis suppurativa of right groin and left buttock   Hidradenitis suppurativa is present in the right groin and left buttock, with recurrent skin infections and abscesses. He has a history of similar issues, including in the axillary region. A new infection in the right groin developed after starting doxycycline . The left buttock cyst was previously identified. Physical examination confirms the diagnosis, and polymyositis has been ruled out. Continue doxycycline  as prescribed. Refer to a dermatologist for further management. Consider excision of subcutaneous tissue if necessary, depending on lesion condition. Contact [facility] to expedite dermatology appointment.   Hidradenitis suppurativa  of anogenital region [L73.2]          Patient verbalized understanding, all questions were answered, and were agreeable with the plan outlined above.   Lucas Sjogren, MD  Electronically signed by Lucas Sjogren, MD

## 2024-06-06 NOTE — H&P (View-Only) (Signed)
 History of Present Illness Manuel Dixon is a 48 year old male who presents with hidradenitis suppurativa in the right groin and left buttock. He was referred to [facility] for dermatological evaluation.  He has a history of skin problems affecting his right groin and left buttock. He has experienced similar issues in the past but has not had any prior interventions for these lesions. The current episode began on Saturday, with the lesion in the right groin area becoming noticeable at that time. Today is the first day it has not drained.  He is currently taking doxycycline , an antibiotic, and has extended the dosage. Despite the antibiotic treatment, the lesion in the groin area became prominent after starting the medication.  He has a history of four surgeries for infected cyst and pilonidal cysts, although he states that these are unrelated to his current skin condition. He had not received a formal diagnosis for his skin issues until now and was unfamiliar with the term 'hidradenitis suppurativa' prior to this visit.  He has not yet had an appointment with the dermatologist at [facility], as the referral process can take several months to a year.      PAST MEDICAL HISTORY:  Past Medical History:  Diagnosis Date  . Benign hypertension 07/14/2016  . 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  FEV1/FVC 67% predicted to be: FEF 25-75% was 63% predicted.  Spiriva, albuterol recommended  . Hyperglycemia 07/14/2016  . Other hyperlipidemia 07/14/2016  . Snoring 09/29/2016   Home sleep study 2/18 with RDI 5, AHI 0, with snoring 28.4% of the study.  ENT evaluation recommended        PAST SURGICAL HISTORY:   Past Surgical History:  Procedure Laterality Date  . PILONIDAL CYST / SINUS EXCISION  2014   Unknown Sharps, MD  . incision and drainage peri-rectal abscess  02/11/2015   Fistula in ano.  . EUA, excision recurrent pilonidal disease  01/29/2020   Dr. Lane  . PILONIDAL  CYST / SINUS EXCISION  03/08/2021  . COLONOSCOPY  04/23/2021   Normal colon/Repeat 26yrs/TKT  . excision skin cyst Right 06/03/2021   chin by Dr Dessa  . OPEN VENTRAL HERNIA REPAIR   05/23/2022   8 cm Ventralight ST mesh.         MEDICATIONS:  Outpatient Encounter Medications as of 06/06/2024  Medication Sig Dispense Refill  . acetaminophen  (TYLENOL ) 325 MG tablet Take 650 mg by mouth every 4 (four) hours as needed for Pain    . albuterol 90 mcg/actuation inhaler Inhale 2 inhalations into the lungs every 4 (four) hours as needed for Wheezing or Shortness of Breath 1 each 1  . doxycycline  (VIBRAMYCIN ) 100 MG capsule Take 100 mg by mouth 2 (two) times daily    . ibuprofen  (MOTRIN ) 200 MG tablet Take 200 mg by mouth every 6 (six) hours as needed for Pain    . lisinopriL  (ZESTRIL ) 10 MG tablet TAKE 1 TABLET BY MOUTH ONCE DAILY 90 tablet 3  . nicotine (NICODERM CQ) 21 mg/24 hr patch Place 1 patch onto the skin daily 28 patch 1  . omega 3-dha-epa-fish oil 1,000 mg (120 mg-180 mg) Cap Take 2,000 mg by mouth once daily    . rosuvastatin (CRESTOR) 10 MG tablet Take 1 tablet (10 mg total) by mouth once daily 90 tablet 3   No facility-administered encounter medications on file as of 06/06/2024.     ALLERGIES:   Amoxicillin, Chantix [varenicline], Codeine, and Hydrocodone  SOCIAL HISTORY:  Social History   Socioeconomic History  . Marital status: Single  Tobacco Use  . Smoking status: Every Day    Current packs/day: 1.00    Average packs/day: 1 pack/day for 20.0 years (20.0 ttl pk-yrs)    Types: Cigarettes  . Smokeless tobacco: Never  Vaping Use  . Vaping status: Never Used  Substance and Sexual Activity  . Alcohol use: Yes    Comment: beer socially  . Drug use: Never  . Sexual activity: Defer   Social Drivers of Health   Financial Resource Strain: Low Risk  (05/25/2023)   Overall Financial Resource Strain (CARDIA)   . Difficulty of Paying Living Expenses: Not hard at all   Food Insecurity: No Food Insecurity (05/25/2023)   Hunger Vital Sign   . Worried About Programme researcher, broadcasting/film/video in the Last Year: Never true   . Ran Out of Food in the Last Year: Never true  Transportation Needs: No Transportation Needs (05/25/2023)   PRAPARE - Transportation   . Lack of Transportation (Medical): No   . Lack of Transportation (Non-Medical): No    FAMILY HISTORY:  Family History  Problem Relation Name Age of Onset  . Myocardial Infarction (Heart attack) Mother    . Diabetes type II Father    . High blood pressure (Hypertension) Father    . Colon cancer Neg Hx       GENERAL REVIEW OF SYSTEMS:   General ROS: negative for - chills, fatigue, fever, weight gain or weight loss Allergy and Immunology ROS: negative for - hives  Hematological and Lymphatic ROS: negative for - bleeding problems or bruising, negative for palpable nodes Endocrine ROS: negative for - heat or cold intolerance, hair changes Respiratory ROS: negative for - cough, shortness of breath or wheezing Cardiovascular ROS: no chest pain or palpitations GI ROS: negative for nausea, vomiting, abdominal pain, diarrhea, constipation Musculoskeletal ROS: negative for - joint swelling or muscle pain Neurological ROS: negative for - confusion, syncope Dermatological ROS: negative for pruritus and rash  PHYSICAL EXAM:  Vitals:   06/06/24 0750  BP: (!) 136/91  Pulse: 88  .  Ht:177.8 cm (5' 10) Wt:98.4 kg (217 lb) ADJ:Anib surface area is 2.2 meters squared. Body mass index is 31.14 kg/m.SABRA   GENERAL: Alert, active, oriented x3  HEENT: Pupils equal reactive to light. Extraocular movements are intact. Sclera clear. Palpebral conjunctiva normal red color.Pharynx clear.  NECK: Supple with no palpable mass and no adenopathy.  LUNGS: Sound clear with no rales rhonchi or wheezes.  HEART: Regular rhythm S1 and S2 without murmur.  ABDOMEN: Soft and depressible, nontender with no palpable mass, no hepatomegaly.    GENITAL: Right groin hidradenitis with acute inflammation and forming abscess.  Left buttock, genital infected hidradenitis  EXTREMITIES: Well-developed well-nourished symmetrical with no dependent edema.  NEUROLOGICAL: Awake alert oriented, facial expression symmetrical, moving all extremities.   Assessment & Plan Hidradenitis suppurativa of right groin and left buttock   Hidradenitis suppurativa is present in the right groin and left buttock, with recurrent skin infections and abscesses. He has a history of similar issues, including in the axillary region. A new infection in the right groin developed after starting doxycycline . The left buttock cyst was previously identified. Physical examination confirms the diagnosis, and polymyositis has been ruled out. Continue doxycycline  as prescribed. Refer to a dermatologist for further management. Consider excision of subcutaneous tissue if necessary, depending on lesion condition. Contact [facility] to expedite dermatology appointment.   Hidradenitis suppurativa  of anogenital region [L73.2]          Patient verbalized understanding, all questions were answered, and were agreeable with the plan outlined above.   Lucas Sjogren, MD  Electronically signed by Lucas Sjogren, MD

## 2024-06-07 ENCOUNTER — Encounter
Admission: RE | Admit: 2024-06-07 | Discharge: 2024-06-07 | Disposition: A | Discharge status: Home / Self Care | Source: Ambulatory Visit | Attending: General Surgery | Admitting: General Surgery

## 2024-06-07 ENCOUNTER — Other Ambulatory Visit: Payer: Self-pay

## 2024-06-07 DIAGNOSIS — J449 Chronic obstructive pulmonary disease, unspecified: Secondary | ICD-10-CM

## 2024-06-07 DIAGNOSIS — I10 Essential (primary) hypertension: Secondary | ICD-10-CM

## 2024-06-07 DIAGNOSIS — L732 Hidradenitis suppurativa: Secondary | ICD-10-CM

## 2024-06-07 HISTORY — DX: Hidradenitis suppurativa: L73.2

## 2024-06-07 NOTE — Patient Instructions (Addendum)
 Your procedure is scheduled on:06/12/24- Wednesday Report to the Registration Desk on the 1st floor of the [facility]. To find out your arrival time, please call [phone] between 1PM - 3PM on: 06/11/24 - Tuesday If your arrival time is 6:00 am, do not arrive before that time as the [facility] entrance doors do not open until 6:00 am.  REMEMBER: Instructions that are not followed completely may result in serious medical risk, up to and including death; or upon the discretion of your surgeon and anesthesiologist your surgery may need to be rescheduled.  Do not eat food or drink any liquids after midnight the night before surgery.  No gum chewing or hard candies.  One week prior to surgery: Stop Anti-inflammatories (NSAIDS) such as Advil , Aleve, Ibuprofen , Motrin , Naproxen, Naprosyn and Aspirin based products such as Excedrin, Goody's Powder, BC Powder.You may take Tylenol  if needed for pain up until the day of surgery.  ON THE DAY OF SURGERY ONLY TAKE THESE MEDICATIONS WITH SIPS OF WATER:  none   No Alcohol for 24 hours before or after surgery.  No Smoking including e-cigarettes for 24 hours before surgery.  No chewable tobacco products for at least 6 hours before surgery.  No nicotine patches on the day of surgery.  Do not use any recreational drugs for at least a week (preferably 2 weeks) before your surgery.  Please be advised that the combination of cocaine and anesthesia may have negative outcomes, up to and including death. If you test positive for cocaine, your surgery will be cancelled.  On the morning of surgery brush your teeth with toothpaste and water, you may rinse your mouth with mouthwash if you wish. Do not swallow any toothpaste or mouthwash.  Use CHG Soap or wipes as directed on instruction sheet.  Do not wear jewelry, make-up, hairpins, clips or nail polish.  For welded (permanent) jewelry: bracelets, anklets, waist bands, etc.  Please have this  removed prior to surgery.  If it is not removed, there is a chance that hospital personnel will need to cut it off on the day of surgery.  Do not wear lotions, powders, or perfumes.   Do not shave body hair from the neck down 48 hours before surgery.  Contact lenses, hearing aids and dentures may not be worn into surgery.  Do not bring valuables to the hospital. [facility] is not responsible for any missing/lost belongings or valuables.   Notify your doctor if there is any change in your medical condition (cold, fever, infection).  Wear comfortable clothing (specific to your surgery type) to the hospital.  After surgery, you can help prevent lung complications by doing breathing exercises.  Take deep breaths and cough every 1-2 hours. Your doctor may order a device called an Incentive Spirometer to help you take deep breaths.  When coughing or sneezing, hold a pillow firmly against your incision with both hands. This is called "splinting." Doing this helps protect your incision. It also decreases belly discomfort.  If you are being admitted to the hospital overnight, leave your suitcase in the car. After surgery it may be brought to your room.  In case of increased patient census, it may be necessary for you, the patient, to continue your postoperative care in the Same Day Surgery department.  If you are being discharged the day of surgery, you will not be allowed to drive home. You will need a responsible individual to drive you home and stay with you for 24 hours  after surgery.   If you are taking public transportation, you will need to have a responsible individual with you.  Please call the Pre-admissions Testing Dept. at [phone] if you have any questions about these instructions.  Surgery Visitation Policy:  Patients having surgery or a procedure may have two visitors.  Children under the age of 29 must have an adult with them who is not the patient.  Inpatient  Visitation:    Visiting hours are 7 a.m. to 8 p.m. Up to four visitors are allowed at one time in a patient room. The visitors may rotate out with other people during the day.  One visitor age 81 or older may stay with the patient overnight and must be in the room by 8 p.m.   Merchandiser, retail to address health-related social needs:  [email or web address]                                                                                                             Preparing for Surgery with CHLORHEXIDINE  GLUCONATE (CHG) Soap  Chlorhexidine  Gluconate (CHG) Soap  o An antiseptic cleaner that kills germs and bonds with the skin to continue killing germs even after washing  o Used for showering the night before surgery and morning of surgery  Before surgery, you can play an important role by reducing the number of germs on your skin.  CHG (Chlorhexidine  gluconate) soap is an antiseptic cleanser which kills germs and bonds with the skin to continue killing germs even after washing.  Please do not use if you have an allergy to CHG or antibacterial soaps. If your skin becomes reddened/irritated stop using the CHG.  1. Shower the NIGHT BEFORE SURGERY with CHG soap.  2. If you choose to wash your hair, wash your hair first as usual with your normal shampoo.  3. After shampooing, rinse your hair and body thoroughly to remove the shampoo.  4. Use CHG as you would any other liquid soap. You can apply CHG directly to the skin and wash gently with a clean washcloth.  5. Apply the CHG soap to your body only from the neck down. Do not use on open wounds or open sores. Avoid contact with your eyes, ears, mouth, and genitals (private parts). Wash face and genitals (private parts) with your normal soap.  6. Wash thoroughly, paying special attention to the area where your surgery will be performed.  7. Thoroughly rinse your body with warm water.  8. Do not shower/wash with your  normal soap after using and rinsing off the CHG soap.  9. Do not use lotions, oils, etc., after showering with CHG.  10. Pat yourself dry with a clean towel.  11. Wear clean pajamas to bed the night before surgery.  12. Place clean sheets on your bed the night of your shower and do not sleep with pets.  13. Do not apply any deodorants/lotions/powders.  14. Please wear clean clothes to the hospital.  15. Remember to brush your teeth with your regular toothpaste.

## 2024-06-10 ENCOUNTER — Encounter
Admission: RE | Admit: 2024-06-10 | Discharge: 2024-06-10 | Disposition: A | Discharge status: Home / Self Care | Source: Ambulatory Visit | Attending: General Surgery | Admitting: General Surgery

## 2024-06-10 DIAGNOSIS — L720 Epidermal cyst: Secondary | ICD-10-CM | POA: Diagnosis not present

## 2024-06-10 DIAGNOSIS — I10 Essential (primary) hypertension: Secondary | ICD-10-CM | POA: Insufficient documentation

## 2024-06-10 DIAGNOSIS — Z01810 Encounter for preprocedural cardiovascular examination: Secondary | ICD-10-CM | POA: Insufficient documentation

## 2024-06-10 DIAGNOSIS — L732 Hidradenitis suppurativa: Secondary | ICD-10-CM | POA: Diagnosis present

## 2024-06-10 DIAGNOSIS — F17210 Nicotine dependence, cigarettes, uncomplicated: Secondary | ICD-10-CM | POA: Diagnosis not present

## 2024-06-10 DIAGNOSIS — J449 Chronic obstructive pulmonary disease, unspecified: Secondary | ICD-10-CM | POA: Insufficient documentation

## 2024-06-10 DIAGNOSIS — Z792 Long term (current) use of antibiotics: Secondary | ICD-10-CM | POA: Diagnosis not present

## 2024-06-12 ENCOUNTER — Encounter
Admission: RE | Disposition: A | Discharge status: Home / Self Care | Payer: Self-pay | Source: Home / Self Care | Attending: General Surgery

## 2024-06-12 ENCOUNTER — Ambulatory Visit
Admission: RE | Admit: 2024-06-12 | Discharge: 2024-06-12 | Disposition: A | Discharge status: Home / Self Care | Attending: General Surgery | Admitting: General Surgery

## 2024-06-12 ENCOUNTER — Other Ambulatory Visit: Payer: Self-pay

## 2024-06-12 ENCOUNTER — Encounter: Payer: Self-pay | Admitting: General Surgery

## 2024-06-12 ENCOUNTER — Ambulatory Visit

## 2024-06-12 DIAGNOSIS — Z792 Long term (current) use of antibiotics: Secondary | ICD-10-CM | POA: Insufficient documentation

## 2024-06-12 DIAGNOSIS — F17210 Nicotine dependence, cigarettes, uncomplicated: Secondary | ICD-10-CM | POA: Insufficient documentation

## 2024-06-12 DIAGNOSIS — J449 Chronic obstructive pulmonary disease, unspecified: Secondary | ICD-10-CM | POA: Insufficient documentation

## 2024-06-12 DIAGNOSIS — L732 Hidradenitis suppurativa: Secondary | ICD-10-CM | POA: Insufficient documentation

## 2024-06-12 DIAGNOSIS — I10 Essential (primary) hypertension: Secondary | ICD-10-CM | POA: Insufficient documentation

## 2024-06-12 DIAGNOSIS — L720 Epidermal cyst: Secondary | ICD-10-CM | POA: Insufficient documentation

## 2024-06-12 HISTORY — PX: CYST REMOVAL LEG: SHX6280

## 2024-06-12 SURGERY — EXCISION, CYST, LOWER EXTREMITY
Anesthesia: General | Site: Buttocks | Laterality: Left

## 2024-06-12 MED ORDER — CEFAZOLIN SODIUM-DEXTROSE 2-4 GM/100ML-% IV SOLN
2.0000 g | INTRAVENOUS | Status: AC
  Administered 2024-06-12: 2 g via INTRAVENOUS

## 2024-06-12 MED ORDER — ONDANSETRON HCL 4 MG/2ML IJ SOLN
INTRAMUSCULAR | Status: DC | PRN
Start: 2024-06-12 — End: 2024-06-12
  Administered 2024-06-12: 4 mg via INTRAVENOUS

## 2024-06-12 MED ORDER — BUPIVACAINE-EPINEPHRINE (PF) 0.5% -1:200000 IJ SOLN
INTRAMUSCULAR | Status: AC
  Filled 2024-06-12: qty 30

## 2024-06-12 MED ORDER — CHLORHEXIDINE GLUCONATE 0.12 % MT SOLN
15.0000 mL | Freq: Once | OROMUCOSAL | Status: AC
  Administered 2024-06-12: 15 mL via OROMUCOSAL

## 2024-06-12 MED ORDER — OXYCODONE HCL 5 MG PO TABS
ORAL_TABLET | ORAL | Status: AC
  Filled 2024-06-12: qty 1

## 2024-06-12 MED ORDER — FENTANYL CITRATE (PF) 100 MCG/2ML IJ SOLN: 25.0000 ug | INTRAMUSCULAR | Status: DC | PRN

## 2024-06-12 MED ORDER — ORAL CARE MOUTH RINSE: 15.0000 mL | Freq: Once | OROMUCOSAL | Status: AC

## 2024-06-12 MED ORDER — MIDAZOLAM HCL 2 MG/2ML IJ SOLN
INTRAMUSCULAR | Status: AC
  Filled 2024-06-12: qty 2

## 2024-06-12 MED ORDER — LIDOCAINE HCL (PF) 2 % IJ SOLN
INTRAMUSCULAR | Status: AC
  Filled 2024-06-12: qty 5

## 2024-06-12 MED ORDER — FENTANYL CITRATE (PF) 100 MCG/2ML IJ SOLN
INTRAMUSCULAR | Status: AC
  Filled 2024-06-12: qty 2

## 2024-06-12 MED ORDER — OXYCODONE HCL 5 MG PO TABS
5.0000 mg | ORAL_TABLET | Freq: Once | ORAL | Status: AC | PRN
  Administered 2024-06-12: 5 mg via ORAL

## 2024-06-12 MED ORDER — ACETAMINOPHEN 10 MG/ML IV SOLN: 1000.0000 mg | Freq: Once | INTRAVENOUS | Status: DC | PRN

## 2024-06-12 MED ORDER — MIDAZOLAM HCL (PF) 2 MG/2ML IJ SOLN
INTRAMUSCULAR | Status: DC | PRN
  Administered 2024-06-12: 2 mg via INTRAVENOUS

## 2024-06-12 MED ORDER — BUPIVACAINE-EPINEPHRINE 0.5% -1:200000 IJ SOLN
INTRAMUSCULAR | Status: DC | PRN
  Administered 2024-06-12: 30 mL

## 2024-06-12 MED ORDER — FENTANYL CITRATE (PF) 100 MCG/2ML IJ SOLN
INTRAMUSCULAR | Status: DC | PRN
  Administered 2024-06-12 (×2): 25 ug via INTRAVENOUS
  Administered 2024-06-12: 50 ug via INTRAVENOUS

## 2024-06-12 MED ORDER — CEFAZOLIN SODIUM-DEXTROSE 2-4 GM/100ML-% IV SOLN
INTRAVENOUS | Status: AC
  Filled 2024-06-12: qty 100

## 2024-06-12 MED ORDER — PROPOFOL 500 MG/50ML IV EMUL
INTRAVENOUS | Status: DC | PRN
  Administered 2024-06-12: 125 ug/kg/min via INTRAVENOUS

## 2024-06-12 MED ORDER — PROPOFOL 10 MG/ML IV BOLUS
INTRAVENOUS | Status: DC | PRN
  Administered 2024-06-12: 50 mg via INTRAVENOUS
  Administered 2024-06-12: 30 mg via INTRAVENOUS

## 2024-06-12 MED ORDER — TRAMADOL HCL 50 MG PO TABS
50.0000 mg | ORAL_TABLET | Freq: Four times a day (QID) | ORAL | 0 refills | Status: AC | PRN
  Filled 2024-06-12: qty 10, 3d supply, fill #0

## 2024-06-12 MED ORDER — OXYCODONE HCL 5 MG/5ML PO SOLN: 5.0000 mg | Freq: Once | ORAL | Status: AC | PRN

## 2024-06-12 MED ORDER — LACTATED RINGERS IV SOLN: INTRAVENOUS | Status: DC

## 2024-06-12 MED ORDER — CHLORHEXIDINE GLUCONATE 0.12 % MT SOLN
OROMUCOSAL | Status: AC
Start: 2024-06-12 — End: 2024-06-12
  Filled 2024-06-12: qty 15

## 2024-06-12 MED ORDER — ONDANSETRON HCL 4 MG/2ML IJ SOLN: 4.0000 mg | Freq: Once | INTRAMUSCULAR | Status: DC | PRN

## 2024-06-12 MED ORDER — PROPOFOL 1000 MG/100ML IV EMUL
INTRAVENOUS | Status: AC
Start: 2024-06-12 — End: 2024-06-12
  Filled 2024-06-12: qty 100

## 2024-06-12 SURGICAL SUPPLY — 21 items
CHLORAPREP W/TINT 26 (MISCELLANEOUS) IMPLANT
DERMABOND ADVANCED .7 DNX12 (GAUZE/BANDAGES/DRESSINGS) ×1 IMPLANT
DRAPE LAPAROTOMY 100X77 ABD (DRAPES) ×1 IMPLANT
ELECTRODE REM PT RTRN 9FT ADLT (ELECTROSURGICAL) ×1 IMPLANT
GLOVE BIO SURGEON STRL SZ 6.5 (GLOVE) ×1 IMPLANT
GLOVE BIOGEL PI IND STRL 6.5 (GLOVE) ×1 IMPLANT
GLOVE SURG SYN 6.5 PF PI (GLOVE) ×2 IMPLANT
GOWN STRL REUS W/ TWL LRG LVL3 (GOWN DISPOSABLE) ×3 IMPLANT
KIT TURNOVER KIT A (KITS) ×1 IMPLANT
LABEL OR SOLS (LABEL) ×1 IMPLANT
MANIFOLD NEPTUNE II (INSTRUMENTS) ×1 IMPLANT
NDL HYPO 25X1 1.5 SAFETY (NEEDLE) ×1 IMPLANT
NEEDLE HYPO 25X1 1.5 SAFETY (NEEDLE) ×1 IMPLANT
NS IRRIG 500ML POUR BTL (IV SOLUTION) ×1 IMPLANT
PACK BASIN MINOR ARMC (MISCELLANEOUS) ×1 IMPLANT
SUT ETHILON 3-0 (SUTURE) IMPLANT
SUT VIC AB 3-0 SH 27X BRD (SUTURE) ×1 IMPLANT
SUTURE MNCRL 4-0 27XMF (SUTURE) ×1 IMPLANT
SYR 10ML LL (SYRINGE) ×1 IMPLANT
TRAP FLUID SMOKE EVACUATOR (MISCELLANEOUS) ×1 IMPLANT
WATER STERILE IRR 500ML POUR (IV SOLUTION) ×1 IMPLANT

## 2024-06-12 NOTE — Transfer of Care (Signed)
 Immediate Anesthesia Transfer of Care Note  Patient: Manuel Dixon  Procedure(s) Performed: EXCISION, CYST, LOWER EXTREMITY (Left: Buttocks)  Patient Location: PACU  Anesthesia Type:General  Level of Consciousness: awake  Airway & Oxygen Therapy: Patient Spontanous Breathing  Post-op Assessment: Report given to RN  Post vital signs: Reviewed and stable  Last Vitals:  Vitals Value Taken Time  BP 118/85 06/12/24 14:49  Temp 36.4 C 06/12/24 14:49  Pulse 91 06/12/24 14:52  Resp 16 06/12/24 14:52  SpO2 98 % 06/12/24 14:52  Vitals shown include unfiled device data.  Last Pain:  Vitals:   06/12/24 1449  TempSrc:   PainSc: 0-No pain         Complications: No notable events documented.

## 2024-06-12 NOTE — Interval H&P Note (Signed)
 History and Physical Interval Note:  06/12/2024 1:16 PM  Manuel Dixon  has presented today for surgery, with the diagnosis of L72.0 epidermal inclusion cyst, L73.2.  The various methods of treatment have been discussed with the patient and family. After consideration of risks, benefits and other options for treatment, the patient has consented to  Procedure(s): EXCISION, CYST, LOWER EXTREMITY (Left) as a surgical intervention.  The patient's history has been reviewed, patient examined, no change in status, stable for surgery.  I have reviewed the patient's chart and labs.  Questions were answered to the patient's satisfaction.     Lucas Sjogren

## 2024-06-12 NOTE — Anesthesia Preprocedure Evaluation (Addendum)
 Anesthesia Evaluation  Patient identified by MRN, date of birth, ID band Patient awake    Reviewed: Allergy & Precautions, H&P , NPO status , Patient's Chart, lab work & pertinent test results, reviewed documented beta blocker date and time   Airway Mallampati: II   Neck ROM: full    Dental  (+) Poor Dentition   Pulmonary pneumonia, resolved, COPD, Current Smoker   Pulmonary exam normal        Cardiovascular Exercise Tolerance: Good hypertension, On Medications Normal cardiovascular exam Rhythm:regular Rate:Normal     Neuro/Psych negative neurological ROS  negative psych ROS   GI/Hepatic negative GI ROS, Neg liver ROS,,,  Endo/Other  negative endocrine ROS    Renal/GU negative Renal ROS  negative genitourinary   Musculoskeletal   Abdominal   Peds  Hematology negative hematology ROS (+)   Anesthesia Other Findings Past Medical History: No date: COPD (chronic obstructive pulmonary disease) (HCC) No date: Hidradenitis suppurativa No date: Hyperlipidemia No date: Hypertension No date: Pilonidal cyst 2015: Pneumonia No date: Pre-diabetes 04/2022: Ventral hernia Past Surgical History: 04/23/2021: COLONOSCOPY 02/11/2015: INCISION AND DRAINAGE PERIRECTAL ABSCESS; N/A     Comment:  Procedure: IRRIGATION AND DEBRIDEMENT PERIRECTAL               ABSCESS;  Surgeon: Reyes LELON Cota, MD;  Location: [facility]              ORS;  Service: General;  Laterality: N/A; 05/23/2022: INSERTION OF MESH; N/A     Comment:  Procedure: INSERTION OF MESH;  Surgeon: Cota Reyes LELON, MD;  Location: [facility] ORS;  Service: General;                Laterality: N/A; 08/15/2012: PILONIDAL CYST EXCISION     Comment:  Dr Claudene 03/08/2021: PILONIDAL CYST EXCISION; N/A     Comment:  Procedure: CYST EXCISION PILONIDAL EXTENSIVE;  Surgeon:               Cota Reyes LELON, MD;  Location: [facility] ORS;  Service:               General;   Laterality: N/A;  General/Endotrachea;               pilonidal cyst with rotation flap 01/29/2020: PILONIDAL CYST EXCISION 05/23/2022: VENTRAL HERNIA REPAIR; N/A     Comment:  Procedure: HERNIA REPAIR VENTRAL ADULT;  Surgeon:               Cota Reyes LELON, MD;  Location: [facility] ORS;  Service:               General;  Laterality: N/A; BMI    Body Mass Index: 32.28 kg/m     Reproductive/Obstetrics negative OB ROS                              Anesthesia Physical Anesthesia Plan  ASA: 2  Anesthesia Plan: General   Post-op Pain Management:    Induction:   PONV Risk Score and Plan: 2  Airway Management Planned:   Additional Equipment:   Intra-op Plan:   Post-operative Plan:   Informed Consent: I have reviewed the patients History and Physical, chart, labs and discussed the procedure including the risks, benefits and alternatives for the proposed anesthesia with the patient or authorized representative who has indicated his/her understanding and acceptance.     Dental  Advisory Given  Plan Discussed with: CRNA  Anesthesia Plan Comments:          Anesthesia Quick Evaluation

## 2024-06-12 NOTE — Discharge Instructions (Addendum)
  Diet: Resume home heart healthy regular diet.   Activity: Increase activity as tolerated. Light activity and walking are encouraged. Do not drive or drink alcohol if taking narcotic pain medications.  Wound care: May shower with soapy water and pat dry (do not rub incisions), but no baths or submerging incision underwater until follow-up. (no swimming)   Medications: Resume all home medications. For mild to moderate pain: acetaminophen (Tylenol) or ibuprofen (if no kidney disease). Combining Tylenol with alcohol can substantially increase your risk of causing liver disease. Narcotic pain medications, if prescribed, can be used for severe pain, though may cause nausea, constipation, and drowsiness. If you do not need the narcotic pain medication, you do not need to fill the prescription.  Call office [phone] at any time if any questions, worsening pain, fevers/chills, bleeding, drainage from incision site, or other concerns.

## 2024-06-12 NOTE — Op Note (Signed)
 OPERATION REPORT  Pre Operative Diagnosis: Left perianal and right groin hidradenitis suppurativa  Post operative diagnosis: Same  Anesthesia: MAC and Local   Surgeon: Dr. Rodolph   Indication: This 48 y.o. year old male with recurrent infection of hidradenitis suppurative   Description of procedure: after orienting patient about the procedure steps and benefits and patient agreed to proceed. Time out was done identifying correct patient and location of procedure. Patient was placed in lithotomy position. After induction of monitored sedation, local anesthesia was infiltrated around the palpable lesions in the left perianal area and in the right groin. Both lesions were excised using the following technique:  With a blade #15, an elliptical incision was made using the skin lines. Sharp dissection was carried down and lesion was excised including dermal tissue. Deep dermal stitches were done with vicryl 4-0 to repair the laceration and skin closed with Monocryl 4-0 in subcuticular fashion. Specimen sent to pathology.    Complications: none   EBL: minimal  Manuel Rodolph, MD, FACS

## 2024-06-13 ENCOUNTER — Encounter: Payer: Self-pay | Admitting: General Surgery

## 2024-06-13 NOTE — Anesthesia Postprocedure Evaluation (Signed)
 Anesthesia Post Note  Patient: Manuel Dixon  Procedure(s) Performed: EXCISION, CYST, LOWER EXTREMITY (Left: Buttocks)  Patient location during evaluation: PACU Anesthesia Type: General Level of consciousness: awake Pain management: satisfactory to patient Vital Signs Assessment: post-procedure vital signs reviewed and stable Respiratory status: spontaneous breathing Cardiovascular status: stable Anesthetic complications: no   No notable events documented.   Last Vitals:  Vitals:   06/12/24 1515 06/12/24 1525  BP: (!) 143/91 (!) 137/96  Pulse: 74 79  Resp: 18 20  Temp: 36.7 C 36.7 C  SpO2: 97% 99%    Last Pain:  Vitals:   06/12/24 1525  TempSrc: Temporal  PainSc: 2                  VAN STAVEREN,Keyion Knack

## 2024-06-14 LAB — SURGICAL PATHOLOGY

## 2024-07-17 ENCOUNTER — Encounter: Payer: Self-pay | Admitting: General Surgery
